# Patient Record
Sex: Female | Born: 1955
Health system: Southern US, Community
[De-identification: ages and names within clinical notes are randomized; demographics above are authoritative.]

## PROBLEM LIST (undated history)

## (undated) DIAGNOSIS — S62109A Fracture of unspecified carpal bone, unspecified wrist, initial encounter for closed fracture: Secondary | ICD-10-CM

## (undated) DIAGNOSIS — S62609A Fracture of unspecified phalanx of unspecified finger, initial encounter for closed fracture: Secondary | ICD-10-CM

## (undated) DIAGNOSIS — R569 Unspecified convulsions: Secondary | ICD-10-CM

## (undated) DIAGNOSIS — B029 Zoster without complications: Secondary | ICD-10-CM

## (undated) DIAGNOSIS — M1990 Unspecified osteoarthritis, unspecified site: Secondary | ICD-10-CM

## (undated) DIAGNOSIS — E039 Hypothyroidism, unspecified: Secondary | ICD-10-CM

## (undated) DIAGNOSIS — E559 Vitamin D deficiency, unspecified: Secondary | ICD-10-CM

## (undated) DIAGNOSIS — E079 Disorder of thyroid, unspecified: Secondary | ICD-10-CM

## (undated) DIAGNOSIS — G40909 Epilepsy, unspecified, not intractable, without status epilepticus: Secondary | ICD-10-CM

## (undated) HISTORY — DX: Fracture of unspecified phalanx of unspecified finger, initial encounter for closed fracture: S62.609A

## (undated) HISTORY — DX: Fracture of unspecified carpal bone, unspecified wrist, initial encounter for closed fracture: S62.109A

## (undated) HISTORY — DX: Epilepsy, unspecified, not intractable, without status epilepticus: G40.909

## (undated) HISTORY — DX: Disorder of thyroid, unspecified: E07.9

## (undated) HISTORY — DX: Vitamin D deficiency, unspecified: E55.9

## (undated) HISTORY — DX: Zoster without complications: B02.9

## (undated) HISTORY — PX: WISDOM TOOTH EXTRACTION: SHX21

---

## 2000-01-12 ENCOUNTER — Encounter: Payer: Self-pay | Admitting: *Deleted

## 2000-01-12 ENCOUNTER — Ambulatory Visit (HOSPITAL_COMMUNITY): Admission: RE | Admit: 2000-01-12 | Discharge: 2000-01-12 | Payer: Self-pay | Admitting: *Deleted

## 2000-02-08 ENCOUNTER — Other Ambulatory Visit: Admission: RE | Admit: 2000-02-08 | Discharge: 2000-02-08 | Payer: Self-pay | Admitting: Obstetrics and Gynecology

## 2001-02-24 ENCOUNTER — Other Ambulatory Visit: Admission: RE | Admit: 2001-02-24 | Discharge: 2001-02-24 | Payer: Self-pay | Admitting: Obstetrics and Gynecology

## 2002-02-26 ENCOUNTER — Other Ambulatory Visit: Admission: RE | Admit: 2002-02-26 | Discharge: 2002-02-26 | Payer: Self-pay | Admitting: Obstetrics and Gynecology

## 2003-05-17 ENCOUNTER — Other Ambulatory Visit: Admission: RE | Admit: 2003-05-17 | Discharge: 2003-05-17 | Payer: Self-pay | Admitting: Obstetrics and Gynecology

## 2005-11-05 ENCOUNTER — Other Ambulatory Visit: Admission: RE | Admit: 2005-11-05 | Discharge: 2005-11-05 | Payer: Self-pay | Admitting: Obstetrics & Gynecology

## 2006-11-04 ENCOUNTER — Other Ambulatory Visit: Admission: RE | Admit: 2006-11-04 | Discharge: 2006-11-04 | Payer: Self-pay | Admitting: Obstetrics and Gynecology

## 2008-01-16 DIAGNOSIS — G40909 Epilepsy, unspecified, not intractable, without status epilepticus: Secondary | ICD-10-CM

## 2008-01-16 HISTORY — PX: KNEE ARTHROSCOPY: SHX127

## 2008-01-16 HISTORY — DX: Epilepsy, unspecified, not intractable, without status epilepticus: G40.909

## 2008-06-15 DIAGNOSIS — E079 Disorder of thyroid, unspecified: Secondary | ICD-10-CM

## 2008-06-15 HISTORY — DX: Disorder of thyroid, unspecified: E07.9

## 2008-07-23 LAB — HM COLONOSCOPY

## 2010-07-26 LAB — HM DEXA SCAN

## 2011-08-02 LAB — HM PAP SMEAR: HM Pap smear: NEGATIVE

## 2012-07-29 ENCOUNTER — Encounter: Payer: Self-pay | Admitting: *Deleted

## 2012-08-04 ENCOUNTER — Ambulatory Visit (INDEPENDENT_AMBULATORY_CARE_PROVIDER_SITE_OTHER): Payer: BC Managed Care – PPO | Admitting: Nurse Practitioner

## 2012-08-04 ENCOUNTER — Encounter: Payer: Self-pay | Admitting: Nurse Practitioner

## 2012-08-04 VITALS — BP 130/72 | HR 78 | Resp 14 | Ht 66.75 in | Wt 248.4 lb

## 2012-08-04 DIAGNOSIS — E559 Vitamin D deficiency, unspecified: Secondary | ICD-10-CM

## 2012-08-04 DIAGNOSIS — Z01419 Encounter for gynecological examination (general) (routine) without abnormal findings: Secondary | ICD-10-CM

## 2012-08-04 DIAGNOSIS — E039 Hypothyroidism, unspecified: Secondary | ICD-10-CM

## 2012-08-04 DIAGNOSIS — Z0000 Encounter for general adult medical examination without abnormal findings: Secondary | ICD-10-CM

## 2012-08-04 LAB — POCT URINALYSIS DIPSTICK
Spec Grav, UA: 1.02
Urobilinogen, UA: NEGATIVE

## 2012-08-04 MED ORDER — VITAMIN D (ERGOCALCIFEROL) 1.25 MG (50000 UNIT) PO CAPS: 50000.0000 [IU] | ORAL_CAPSULE | ORAL | Status: DC

## 2012-08-04 NOTE — Progress Notes (Signed)
57 y.o. [id number] Married Caucasian Fe here for annual exam.  No new health problems.  She has some fatigue during tax season and PCP increased her Synthroid.  She has not gone back to get rechecked and she wants TSH rechecked today.  No LMP recorded. Patient is postmenopausal.          Sexually active: yes  The current method of family planning is vasectomy.    Exercising: no  The patient does not participate in regular exercise at present. Smoker:  no  Health Maintenance: Pap:  08/02/2011 normal with negative HR HPV MMG:  07/26/2010 Colonoscopy:  07/23/2008 scattered diverticula, recheck in 10 years BMD:   07/26/2010  T Score: spine 3.3/ right hip neck -0.7 / total 0.0 TDaP:  PCP maintains tetanus.  Labs: Hgb- 14.0    reports that she has never smoked. She has never used smokeless tobacco. She reports that she drinks about 0.5 ounces of alcohol per week. She reports that she does not use illicit drugs.  Past Medical History  Diagnosis Date  . Thyroid disease     hypothyroidism  . Vitamin D deficiency   . Seizure disorder     Past Surgical History  Procedure Laterality Date  . Vaginal delivery  x3  . Knee arthroscopy Right 2010    Current Outpatient Prescriptions  Medication Sig Dispense Refill  . clobetasol (OLUX) 0.05 % topical foam Apply topically 2 (two) times daily.      Marland Kitchen doxycycline (VIBRA-TABS) 100 MG tablet Take 100 mg by mouth daily.      Marland Kitchen lamoTRIgine (LAMICTAL) 100 MG tablet Take 100 mg by mouth 2 (two) times daily.      Marland Kitchen levothyroxine (SYNTHROID, LEVOTHROID) 50 MCG tablet Take 75 mcg by mouth daily before breakfast.       . meloxicam (MOBIC) 15 MG tablet Take 15 mg by mouth daily.      . metroNIDAZOLE (METROGEL) 0.75 % gel Apply topically 2 (two) times daily.      . Vitamin D, Ergocalciferol, (DRISDOL) 50000 UNITS CAPS Take 50,000 Units by mouth every 7 (seven) days.       No current facility-administered medications for this visit.    Family History  Problem  Relation Age of Onset  . Pulmonary disease Mother   . Cancer Father     prostate cancer  . Hypertension Father   . Renal Disease Father   . Diabetes Paternal Grandfather     ROS:  Pertinent items are noted in HPI.  Otherwise, a comprehensive ROS was negative.  Exam:   BP 130/72  Pulse 78  Resp 14  Ht 5' 6.75" (1.695 m)  Wt 248 lb 6.4 oz (112.674 kg)  BMI 39.22 kg/m2 Height: 5' 6.75" (169.5 cm)  Ht Readings from Last 3 Encounters:  08/04/12 5' 6.75" (1.695 m)    General appearance: alert, cooperative and appears stated age Head: Normocephalic, without obvious abnormality, atraumatic Neck: no adenopathy, supple, symmetrical, trachea midline and thyroid normal to inspection and palpation Lungs: clear to auscultation bilaterally Breasts: normal appearance, no masses or tenderness Heart: regular rate and rhythm Abdomen: soft, non-tender; no masses,  no organomegaly Extremities: extremities normal, atraumatic, no cyanosis or edema Skin: Skin color, texture, turgor normal. No rashes or lesions Lymph nodes: Cervical, supraclavicular, and axillary nodes normal. No abnormal inguinal nodes palpated Neurologic: Grossly normal   Pelvic: External genitalia:  no lesions              Urethra:  normal  appearing urethra with no masses, tenderness or lesions              Bartholin's and Skene's: normal                 Vagina: normal appearing vagina with normal color and discharge, no lesions              Cervix: anteverted              Pap taken: no Bimanual Exam:  Uterus:  normal size, contour, position, consistency, mobility, non-tender              Adnexa: no mass, fullness, tenderness               Rectovaginal: Confirms               Anus:  normal sphincter tone, no lesions  A:  Well Woman with normal exam  Postmenopausal  History of Hypothyroid  History of Vit  D Deficiency  P:   Pap smear as per guidelines   Mammogram due now and patient will make appointment  Since PCP -  Dr. Ricki Miller has changed dose of synthroid - will get labs to him to follow  counseled on breast self exam, adequate intake of calcium and vitamin D,   diet and exercise return annually or prn  An After Visit Summary was printed and given to the patient.

## 2012-08-04 NOTE — Patient Instructions (Signed)

## 2012-08-06 NOTE — Progress Notes (Signed)
Encounter reviewed by Dr. Brook Silva.  

## 2012-08-07 ENCOUNTER — Telehealth: Payer: Self-pay | Admitting: *Deleted

## 2012-08-07 NOTE — Telephone Encounter (Signed)
Pt is aware of all lab results and is agreeable to Vitamin D 50,000 IU 1 po q weekly (pt has Rx left over from last year-2013).

## 2012-08-07 NOTE — Telephone Encounter (Signed)
Message copied by Osie Bond on Thu Aug 07, 2012 10:45 AM ------      Message from: Ria Comment R      Created: Tue Aug 05, 2012  5:41 PM       Let patient know normal labs, follow Vit d per protocol. ------

## 2012-11-20 ENCOUNTER — Other Ambulatory Visit: Payer: Self-pay

## 2013-01-07 ENCOUNTER — Encounter: Payer: Self-pay | Admitting: Nurse Practitioner

## 2013-06-01 ENCOUNTER — Other Ambulatory Visit (HOSPITAL_COMMUNITY): Payer: Self-pay | Admitting: Orthopaedic Surgery

## 2013-06-04 ENCOUNTER — Encounter (HOSPITAL_COMMUNITY): Payer: Self-pay | Admitting: Pharmacy Technician

## 2013-06-09 NOTE — Patient Instructions (Signed)
Audrey Chandler  06/09/2013   Your procedure is scheduled on:  06/19/2013  0945am-1130am  Report to [facility].  Follow the Signs to [place] at       [place]      am  Call this number if you have problems the morning of surgery: [phone]   Remember:   Do not eat food or drink liquids after midnight.   Take these medicines the morning of surgery with A SIP OF WATER:    Do not wear jewelry, make-up or nail polish.  Do not wear lotions, powders, or perfumes.   Do not shave 48 hours prior to surgery.   Do not bring valuables to the hospital.  Contacts, dentures or bridgework may not be worn into surgery.  Leave suitcase in the car. After surgery it may be brought to your room.  For patients admitted to the hospital, checkout time is 11:00 AM the day of  discharge.    [facility] - Preparing for Surgery Before surgery, you can play an important role.  Because skin is not sterile, your skin needs to be as free of germs as possible.  You can reduce the number of germs on your skin by washing with CHG (chlorahexidine gluconate) soap before surgery.  CHG is an antiseptic cleaner which kills germs and bonds with the skin to continue killing germs even after washing. Please DO NOT use if you have an allergy to CHG or antibacterial soaps.  If your skin becomes reddened/irritated stop using the CHG and inform your nurse when you arrive at Short Stay. Do not shave (including legs and underarms) for at least 48 hours prior to the first CHG shower.  You may shave your face/neck. Please follow these instructions carefully:  1.  Shower with CHG Soap the night before surgery and the  morning of Surgery.  2.  If you choose to wash your hair, wash your hair first as usual with your  normal  shampoo.  3.  After you shampoo, rinse your hair and body thoroughly to remove the  shampoo.                           4.  Use CHG as you would any other liquid soap.  You can apply chg directly  to  the skin and wash                       Gently with a scrungie or clean washcloth.  5.  Apply the CHG Soap to your body ONLY FROM THE NECK DOWN.   Do not use on face/ open                           Wound or open sores. Avoid contact with eyes, ears mouth and genitals (private parts).                       Wash face,  Genitals (private parts) with your normal soap.             6.  Wash thoroughly, paying special attention to the area where your surgery  will be performed.  7.  Thoroughly rinse your body with warm water from the neck down.  8.  DO NOT shower/wash with your normal soap after using and rinsing off  the CHG Soap.  9.  Pat yourself dry with a clean towel.            10.  Wear clean pajamas.            11.  Place clean sheets on your bed the night of your first shower and do not  sleep with pets. Day of Surgery : Do not apply any lotions/deodorants the morning of surgery.  Please wear clean clothes to the hospital/surgery center.  FAILURE TO FOLLOW THESE INSTRUCTIONS MAY RESULT IN THE CANCELLATION OF YOUR SURGERY PATIENT SIGNATURE_________________________________  NURSE SIGNATURE__________________________________  ________________________________________________________________________  WHAT IS A BLOOD TRANSFUSION? Blood Transfusion Information  A transfusion is the replacement of blood or some of its parts. Blood is made up of multiple cells which provide different functions.  Red blood cells carry oxygen and are used for blood loss replacement.  White blood cells fight against infection.  Platelets control bleeding.  Plasma helps clot blood.  Other blood products are available for specialized needs, such as hemophilia or other clotting disorders. BEFORE THE TRANSFUSION  Who gives blood for transfusions?   Healthy volunteers who are fully evaluated to make sure their blood is safe. This is blood bank blood. Transfusion therapy is the safest it has ever  been in the practice of medicine. Before blood is taken from a donor, a complete history is taken to make sure that person has no history of diseases nor engages in risky social behavior (examples are intravenous drug use or sexual activity with multiple partners). The donor's travel history is screened to minimize risk of transmitting infections, such as malaria. The donated blood is tested for signs of infectious diseases, such as HIV and hepatitis. The blood is then tested to be sure it is compatible with you in order to minimize the chance of a transfusion reaction. If you or a relative donates blood, this is often done in anticipation of surgery and is not appropriate for emergency situations. It takes many days to process the donated blood. RISKS AND COMPLICATIONS Although transfusion therapy is very safe and saves many lives, the main dangers of transfusion include:   Getting an infectious disease.  Developing a transfusion reaction. This is an allergic reaction to something in the blood you were given. Every precaution is taken to prevent this. The decision to have a blood transfusion has been considered carefully by your caregiver before blood is given. Blood is not given unless the benefits outweigh the risks. AFTER THE TRANSFUSION  Right after receiving a blood transfusion, you will usually feel much better and more energetic. This is especially true if your red blood cells have gotten low (anemic). The transfusion raises the level of the red blood cells which carry oxygen, and this usually causes an energy increase.  The nurse administering the transfusion will monitor you carefully for complications. HOME CARE INSTRUCTIONS  No special instructions are needed after a transfusion. You may find your energy is better. Speak with your caregiver about any limitations on activity for underlying diseases you may have. SEEK MEDICAL CARE IF:   Your condition is not improving after your  transfusion.  You develop redness or irritation at the intravenous (IV) site. SEEK IMMEDIATE MEDICAL CARE IF:  Any of the following symptoms occur over the next 12 hours:  Shaking chills.  You have a temperature by mouth above 102 F (38.9 C), not controlled by medicine.  Chest, back, or muscle pain.  People around you feel you are not acting correctly or  are confused.  Shortness of breath or difficulty breathing.  Dizziness and fainting.  You get a rash or develop hives.  You have a decrease in urine output.  Your urine turns a dark color or changes to pink, red, or brown. Any of the following symptoms occur over the next 10 days:  You have a temperature by mouth above 102 F (38.9 C), not controlled by medicine.  Shortness of breath.  Weakness after normal activity.  The white part of the eye turns yellow (jaundice).  You have a decrease in the amount of urine or are urinating less often.  Your urine turns a dark color or changes to pink, red, or brown. Document Released: 12/30/1999 Document Revised: 03/26/2011 Document Reviewed: 08/18/2007 ExitCare Patient Information 2014 ExitCare, [place].  _______________________________________________________________________   Please read over the following fact sheets that you were given: MRSA Information, coughing and deep breathing exercises, leg exercises

## 2013-06-11 ENCOUNTER — Encounter (HOSPITAL_COMMUNITY): Payer: Self-pay

## 2013-06-11 ENCOUNTER — Encounter (HOSPITAL_COMMUNITY)
Admission: RE | Admit: 2013-06-11 | Discharge: 2013-06-11 | Disposition: A | Discharge status: Home / Self Care | Payer: BC Managed Care – PPO | Source: Ambulatory Visit | Attending: Orthopaedic Surgery | Admitting: Orthopaedic Surgery

## 2013-06-11 DIAGNOSIS — Z01812 Encounter for preprocedural laboratory examination: Secondary | ICD-10-CM | POA: Insufficient documentation

## 2013-06-11 HISTORY — DX: Unspecified convulsions: R56.9

## 2013-06-11 HISTORY — DX: Hypothyroidism, unspecified: E03.9

## 2013-06-11 HISTORY — DX: Unspecified osteoarthritis, unspecified site: M19.90

## 2013-06-11 LAB — CBC
HCT: 41.4 % (ref 36.0–46.0)
HEMOGLOBIN: 14.1 g/dL (ref 12.0–15.0)
MCH: 29.8 pg (ref 26.0–34.0)
MCHC: 34.1 g/dL (ref 30.0–36.0)
MCV: 87.5 fL (ref 78.0–100.0)
Platelets: 183 10*3/uL (ref 150–400)
RBC: 4.73 MIL/uL (ref 3.87–5.11)
RDW: 12.7 % (ref 11.5–15.5)
WBC: 4.7 10*3/uL (ref 4.0–10.5)

## 2013-06-11 LAB — BASIC METABOLIC PANEL
BUN: 18 mg/dL (ref 6–23)
CHLORIDE: 102 meq/L (ref 96–112)
CO2: 29 mEq/L (ref 19–32)
Calcium: 9.3 mg/dL (ref 8.4–10.5)
Creatinine, Ser: 0.91 mg/dL (ref 0.50–1.10)
GFR calc non Af Amer: 68 mL/min — ABNORMAL LOW (ref >90)
GFR, EST AFRICAN AMERICAN: 78 mL/min — AB (ref >90)
Glucose, Bld: 107 mg/dL — ABNORMAL HIGH (ref 70–99)
POTASSIUM: 4.5 meq/L (ref 3.7–5.3)
SODIUM: 143 meq/L (ref 137–147)

## 2013-06-11 LAB — URINALYSIS, ROUTINE W REFLEX MICROSCOPIC
Bilirubin Urine: NEGATIVE
Glucose, UA: NEGATIVE mg/dL
Hgb urine dipstick: NEGATIVE
Ketones, ur: NEGATIVE mg/dL
Leukocytes, UA: NEGATIVE
Nitrite: NEGATIVE
PROTEIN: NEGATIVE mg/dL
Specific Gravity, Urine: 1.014 (ref 1.005–1.030)
Urobilinogen, UA: 0.2 mg/dL (ref 0.0–1.0)
pH: 6 (ref 5.0–8.0)

## 2013-06-11 LAB — PROTIME-INR
INR: 1 (ref 0.00–1.49)
PROTHROMBIN TIME: 13 s (ref 11.6–15.2)

## 2013-06-11 LAB — SURGICAL PCR SCREEN
MRSA, PCR: NEGATIVE
STAPHYLOCOCCUS AUREUS: NEGATIVE

## 2013-06-11 LAB — APTT: aPTT: 34 seconds (ref 24–37)

## 2013-06-19 ENCOUNTER — Encounter (HOSPITAL_COMMUNITY): Payer: BC Managed Care – PPO | Admitting: Anesthesiology

## 2013-06-19 ENCOUNTER — Encounter (HOSPITAL_COMMUNITY): Payer: Self-pay | Admitting: *Deleted

## 2013-06-19 ENCOUNTER — Encounter (HOSPITAL_COMMUNITY)
Admission: RE | Disposition: A | Discharge status: Home Health | Payer: Self-pay | Source: Ambulatory Visit | Attending: Orthopaedic Surgery

## 2013-06-19 ENCOUNTER — Inpatient Hospital Stay (HOSPITAL_COMMUNITY): Payer: BC Managed Care – PPO

## 2013-06-19 ENCOUNTER — Inpatient Hospital Stay (HOSPITAL_COMMUNITY): Payer: BC Managed Care – PPO | Admitting: Anesthesiology

## 2013-06-19 ENCOUNTER — Inpatient Hospital Stay (HOSPITAL_COMMUNITY)
Admission: RE | Admit: 2013-06-19 | Discharge: 2013-06-22 | DRG: 470 | Disposition: A | Discharge status: Home Health | Payer: BC Managed Care – PPO | Source: Ambulatory Visit | Attending: Orthopaedic Surgery | Admitting: Orthopaedic Surgery

## 2013-06-19 DIAGNOSIS — Z6839 Body mass index (BMI) 39.0-39.9, adult: Secondary | ICD-10-CM

## 2013-06-19 DIAGNOSIS — E039 Hypothyroidism, unspecified: Secondary | ICD-10-CM | POA: Diagnosis present

## 2013-06-19 DIAGNOSIS — M1612 Unilateral primary osteoarthritis, left hip: Secondary | ICD-10-CM

## 2013-06-19 DIAGNOSIS — Z96649 Presence of unspecified artificial hip joint: Secondary | ICD-10-CM

## 2013-06-19 DIAGNOSIS — M169 Osteoarthritis of hip, unspecified: Principal | ICD-10-CM | POA: Diagnosis present

## 2013-06-19 DIAGNOSIS — Z8249 Family history of ischemic heart disease and other diseases of the circulatory system: Secondary | ICD-10-CM

## 2013-06-19 DIAGNOSIS — Z833 Family history of diabetes mellitus: Secondary | ICD-10-CM

## 2013-06-19 DIAGNOSIS — Z87891 Personal history of nicotine dependence: Secondary | ICD-10-CM

## 2013-06-19 DIAGNOSIS — M1610 Unilateral primary osteoarthritis, unspecified hip: Principal | ICD-10-CM | POA: Diagnosis present

## 2013-06-19 DIAGNOSIS — Z01812 Encounter for preprocedural laboratory examination: Secondary | ICD-10-CM

## 2013-06-19 HISTORY — PX: TOTAL HIP ARTHROPLASTY: SHX124

## 2013-06-19 LAB — TYPE AND SCREEN
ABO/RH(D): O POS
Antibody Screen: NEGATIVE

## 2013-06-19 LAB — ABO/RH: ABO/RH(D): O POS

## 2013-06-19 SURGERY — ARTHROPLASTY, HIP, TOTAL, ANTERIOR APPROACH
Anesthesia: Spinal | Site: Hip | Laterality: Left

## 2013-06-19 MED ORDER — METHOCARBAMOL 1000 MG/10ML IJ SOLN
500.0000 mg | Freq: Four times a day (QID) | INTRAVENOUS | Status: DC | PRN
  Administered 2013-06-19: 500 mg via INTRAVENOUS
  Filled 2013-06-19: qty 5

## 2013-06-19 MED ORDER — MIDAZOLAM HCL 2 MG/2ML IJ SOLN
INTRAMUSCULAR | Status: AC
  Filled 2013-06-19: qty 2

## 2013-06-19 MED ORDER — PROPOFOL 10 MG/ML IV BOLUS
INTRAVENOUS | Status: AC
  Filled 2013-06-19: qty 20

## 2013-06-19 MED ORDER — PROPOFOL INFUSION 10 MG/ML OPTIME
INTRAVENOUS | Status: DC | PRN
  Administered 2013-06-19: 100 ug/kg/min via INTRAVENOUS

## 2013-06-19 MED ORDER — ONDANSETRON HCL 4 MG/2ML IJ SOLN
4.0000 mg | Freq: Four times a day (QID) | INTRAMUSCULAR | Status: DC | PRN
  Administered 2013-06-19: 4 mg via INTRAVENOUS
  Filled 2013-06-19: qty 2

## 2013-06-19 MED ORDER — DOCUSATE SODIUM 100 MG PO CAPS
100.0000 mg | ORAL_CAPSULE | Freq: Two times a day (BID) | ORAL | Status: DC
  Administered 2013-06-19 – 2013-06-22 (×6): 100 mg via ORAL

## 2013-06-19 MED ORDER — LACTATED RINGERS IV SOLN
INTRAVENOUS | Status: DC | PRN
  Administered 2013-06-19 (×3): via INTRAVENOUS

## 2013-06-19 MED ORDER — 0.9 % SODIUM CHLORIDE (POUR BTL) OPTIME
TOPICAL | Status: DC | PRN
  Administered 2013-06-19: 1000 mL

## 2013-06-19 MED ORDER — HYDROMORPHONE HCL PF 1 MG/ML IJ SOLN
1.0000 mg | INTRAMUSCULAR | Status: DC | PRN
  Administered 2013-06-19 – 2013-06-20 (×3): 1 mg via INTRAVENOUS
  Filled 2013-06-19 (×3): qty 1

## 2013-06-19 MED ORDER — CEFAZOLIN SODIUM-DEXTROSE 2-3 GM-% IV SOLR
2.0000 g | Freq: Four times a day (QID) | INTRAVENOUS | Status: AC
  Administered 2013-06-19 (×2): 2 g via INTRAVENOUS
  Filled 2013-06-19 (×2): qty 50

## 2013-06-19 MED ORDER — PROMETHAZINE HCL 25 MG/ML IJ SOLN: 6.2500 mg | INTRAMUSCULAR | Status: DC | PRN

## 2013-06-19 MED ORDER — ALUM & MAG HYDROXIDE-SIMETH 200-200-20 MG/5ML PO SUSP: 30.0000 mL | ORAL | Status: DC | PRN

## 2013-06-19 MED ORDER — ASPIRIN EC 325 MG PO TBEC
325.0000 mg | DELAYED_RELEASE_TABLET | Freq: Two times a day (BID) | ORAL | Status: DC
  Administered 2013-06-20 – 2013-06-22 (×5): 325 mg via ORAL
  Filled 2013-06-19 (×8): qty 1

## 2013-06-19 MED ORDER — SODIUM CHLORIDE 0.9 % IR SOLN
Status: DC | PRN
  Administered 2013-06-19: 1000 mL

## 2013-06-19 MED ORDER — BUPIVACAINE HCL (PF) 0.5 % IJ SOLN
INTRAMUSCULAR | Status: AC
  Filled 2013-06-19: qty 30

## 2013-06-19 MED ORDER — SODIUM CHLORIDE 0.9 % IV SOLN
INTRAVENOUS | Status: DC
  Administered 2013-06-19 – 2013-06-20 (×2): via INTRAVENOUS

## 2013-06-19 MED ORDER — BUPIVACAINE HCL (PF) 0.5 % IJ SOLN
INTRAMUSCULAR | Status: DC | PRN
  Administered 2013-06-19: 3 mL

## 2013-06-19 MED ORDER — METOCLOPRAMIDE HCL 5 MG/ML IJ SOLN: 5.0000 mg | Freq: Three times a day (TID) | INTRAMUSCULAR | Status: DC | PRN

## 2013-06-19 MED ORDER — POLYETHYLENE GLYCOL 3350 17 G PO PACK
17.0000 g | PACK | Freq: Every day | ORAL | Status: DC | PRN
  Administered 2013-06-20 – 2013-06-21 (×2): 17 g via ORAL

## 2013-06-19 MED ORDER — ONDANSETRON HCL 4 MG/2ML IJ SOLN
INTRAMUSCULAR | Status: AC
  Filled 2013-06-19: qty 2

## 2013-06-19 MED ORDER — KETAMINE HCL 10 MG/ML IJ SOLN
INTRAMUSCULAR | Status: DC | PRN
  Administered 2013-06-19 (×5): 5 mg via INTRAVENOUS

## 2013-06-19 MED ORDER — MEPERIDINE HCL 50 MG/ML IJ SOLN: 6.2500 mg | INTRAMUSCULAR | Status: DC | PRN

## 2013-06-19 MED ORDER — DIPHENHYDRAMINE HCL 12.5 MG/5ML PO ELIX: 12.5000 mg | ORAL_SOLUTION | ORAL | Status: DC | PRN

## 2013-06-19 MED ORDER — OXYCODONE HCL 5 MG/5ML PO SOLN
5.0000 mg | Freq: Once | ORAL | Status: DC | PRN
  Filled 2013-06-19: qty 5

## 2013-06-19 MED ORDER — METOCLOPRAMIDE HCL 10 MG PO TABS: 5.0000 mg | ORAL_TABLET | Freq: Three times a day (TID) | ORAL | Status: DC | PRN

## 2013-06-19 MED ORDER — OXYCODONE HCL 5 MG PO TABS: 5.0000 mg | ORAL_TABLET | Freq: Once | ORAL | Status: DC | PRN

## 2013-06-19 MED ORDER — MENTHOL 3 MG MT LOZG
1.0000 | LOZENGE | OROMUCOSAL | Status: DC | PRN
  Filled 2013-06-19: qty 9

## 2013-06-19 MED ORDER — LIDOCAINE HCL (CARDIAC) 20 MG/ML IV SOLN
INTRAVENOUS | Status: AC
  Filled 2013-06-19: qty 5

## 2013-06-19 MED ORDER — METHOCARBAMOL 500 MG PO TABS
500.0000 mg | ORAL_TABLET | Freq: Four times a day (QID) | ORAL | Status: DC | PRN
  Administered 2013-06-19 – 2013-06-20 (×2): 500 mg via ORAL
  Filled 2013-06-19 (×2): qty 1

## 2013-06-19 MED ORDER — OXYCODONE HCL 5 MG PO TABS
5.0000 mg | ORAL_TABLET | ORAL | Status: DC | PRN
  Administered 2013-06-19: 5 mg via ORAL
  Administered 2013-06-20 (×5): 10 mg via ORAL
  Filled 2013-06-19: qty 2
  Filled 2013-06-19: qty 1
  Filled 2013-06-19 (×4): qty 2

## 2013-06-19 MED ORDER — HYDROMORPHONE HCL PF 1 MG/ML IJ SOLN: 0.2500 mg | INTRAMUSCULAR | Status: DC | PRN

## 2013-06-19 MED ORDER — CEFAZOLIN SODIUM-DEXTROSE 2-3 GM-% IV SOLR
INTRAVENOUS | Status: AC
  Filled 2013-06-19: qty 50

## 2013-06-19 MED ORDER — ONDANSETRON HCL 4 MG PO TABS
4.0000 mg | ORAL_TABLET | Freq: Four times a day (QID) | ORAL | Status: DC | PRN
  Administered 2013-06-21: 4 mg via ORAL

## 2013-06-19 MED ORDER — ONDANSETRON HCL 4 MG/2ML IJ SOLN
INTRAMUSCULAR | Status: DC | PRN
  Administered 2013-06-19: 4 mg via INTRAVENOUS

## 2013-06-19 MED ORDER — PHENOL 1.4 % MT LIQD
1.0000 | OROMUCOSAL | Status: DC | PRN
  Filled 2013-06-19: qty 177

## 2013-06-19 MED ORDER — TRANEXAMIC ACID 100 MG/ML IV SOLN
1000.0000 mg | INTRAVENOUS | Status: AC
  Administered 2013-06-19: 1000 mg via INTRAVENOUS
  Filled 2013-06-19: qty 10

## 2013-06-19 MED ORDER — ACETAMINOPHEN 10 MG/ML IV SOLN
1000.0000 mg | Freq: Once | INTRAVENOUS | Status: AC
Start: 2013-06-19 — End: 2013-06-19
  Administered 2013-06-19: 1000 mg via INTRAVENOUS
  Filled 2013-06-19: qty 100

## 2013-06-19 MED ORDER — ACETAMINOPHEN 650 MG RE SUPP: 650.0000 mg | Freq: Four times a day (QID) | RECTAL | Status: DC | PRN

## 2013-06-19 MED ORDER — PROPOFOL 10 MG/ML IV EMUL
INTRAVENOUS | Status: DC | PRN
  Administered 2013-06-19: 30 mg via INTRAVENOUS

## 2013-06-19 MED ORDER — CLOBETASOL PROPIONATE 0.05 % EX FOAM: 1.0000 "application " | Freq: Two times a day (BID) | CUTANEOUS | Status: DC | PRN

## 2013-06-19 MED ORDER — LAMOTRIGINE 100 MG PO TABS
100.0000 mg | ORAL_TABLET | Freq: Two times a day (BID) | ORAL | Status: DC
  Administered 2013-06-19 – 2013-06-22 (×6): 100 mg via ORAL
  Filled 2013-06-19 (×7): qty 1

## 2013-06-19 MED ORDER — MIDAZOLAM HCL 5 MG/5ML IJ SOLN
INTRAMUSCULAR | Status: DC | PRN
  Administered 2013-06-19: 2 mg via INTRAVENOUS

## 2013-06-19 MED ORDER — DOXYCYCLINE HYCLATE 100 MG PO TABS
100.0000 mg | ORAL_TABLET | Freq: Every day | ORAL | Status: DC
  Administered 2013-06-19: 100 mg via ORAL
  Filled 2013-06-19 (×2): qty 1

## 2013-06-19 MED ORDER — LEVOTHYROXINE SODIUM 75 MCG PO TABS
75.0000 ug | ORAL_TABLET | Freq: Every day | ORAL | Status: DC
  Administered 2013-06-20 – 2013-06-22 (×3): 75 ug via ORAL
  Filled 2013-06-19 (×4): qty 1

## 2013-06-19 MED ORDER — ACETAMINOPHEN 325 MG PO TABS
650.0000 mg | ORAL_TABLET | Freq: Four times a day (QID) | ORAL | Status: DC | PRN
  Administered 2013-06-20 – 2013-06-22 (×4): 650 mg via ORAL
  Filled 2013-06-19 (×5): qty 2

## 2013-06-19 MED ORDER — CEFAZOLIN SODIUM-DEXTROSE 2-3 GM-% IV SOLR
2.0000 g | INTRAVENOUS | Status: AC
  Administered 2013-06-19: 2 g via INTRAVENOUS

## 2013-06-19 SURGICAL SUPPLY — 45 items
APL SKNCLS STERI-STRIP NONHPOA (GAUZE/BANDAGES/DRESSINGS)
BAG SPEC THK2 15X12 ZIP CLS (MISCELLANEOUS)
BAG ZIPLOCK 12X15 (MISCELLANEOUS) IMPLANT
BENZOIN TINCTURE PRP APPL 2/3 (GAUZE/BANDAGES/DRESSINGS) IMPLANT
BLADE SAW SGTL 18X1.27X75 (BLADE) ×2 IMPLANT
BLADE SAW SGTL 18X1.27X75MM (BLADE) ×1
CAPT HIP PF COP ×2 IMPLANT
CELLS DAT CNTRL 66122 CELL SVR (MISCELLANEOUS) ×1 IMPLANT
CLOSURE WOUND 1/2 X4 (GAUZE/BANDAGES/DRESSINGS)
COVER PERINEAL POST (MISCELLANEOUS) ×3 IMPLANT
DRAPE C-ARM 42X120 X-RAY (DRAPES) ×3 IMPLANT
DRAPE STERI IOBAN 125X83 (DRAPES) ×3 IMPLANT
DRAPE U-SHAPE 47X51 STRL (DRAPES) ×9 IMPLANT
DRSG AQUACEL AG ADV 3.5X10 (GAUZE/BANDAGES/DRESSINGS) ×3 IMPLANT
DRSG XEROFORM 1X8 (GAUZE/BANDAGES/DRESSINGS) ×2 IMPLANT
DURAPREP 26ML APPLICATOR (WOUND CARE) ×3 IMPLANT
ELECT BLADE TIP CTD 4 INCH (ELECTRODE) ×3 IMPLANT
ELECT REM PT RETURN 9FT ADLT (ELECTROSURGICAL) ×3
ELECTRODE REM PT RTRN 9FT ADLT (ELECTROSURGICAL) ×1 IMPLANT
FACESHIELD WRAPAROUND (MASK) ×12 IMPLANT
FACESHIELD WRAPAROUND OR TEAM (MASK) ×4 IMPLANT
GAUZE XEROFORM 1X8 LF (GAUZE/BANDAGES/DRESSINGS) ×2 IMPLANT
GLOVE BIO SURGEON STRL SZ7.5 (GLOVE) ×3 IMPLANT
GLOVE BIOGEL PI IND STRL 8 (GLOVE) ×2 IMPLANT
GLOVE BIOGEL PI INDICATOR 8 (GLOVE) ×4
GLOVE ECLIPSE 8.0 STRL XLNG CF (GLOVE) ×3 IMPLANT
GOWN STRL REUS W/TWL XL LVL3 (GOWN DISPOSABLE) ×6 IMPLANT
HANDPIECE INTERPULSE COAX TIP (DISPOSABLE) ×3
KIT BASIN OR (CUSTOM PROCEDURE TRAY) ×3 IMPLANT
PACK TOTAL JOINT (CUSTOM PROCEDURE TRAY) ×3 IMPLANT
RETRACTOR WND ALEXIS 18 MED (MISCELLANEOUS) ×1 IMPLANT
RTRCTR WOUND ALEXIS 18CM MED (MISCELLANEOUS) ×3
SET HNDPC FAN SPRY TIP SCT (DISPOSABLE) ×1 IMPLANT
STAPLER VISISTAT 35W (STAPLE) ×2 IMPLANT
STRIP CLOSURE SKIN 1/2X4 (GAUZE/BANDAGES/DRESSINGS) IMPLANT
SUT ETHIBOND NAB CT1 #1 30IN (SUTURE) ×3 IMPLANT
SUT ETHILON 3 0 PS 1 (SUTURE) IMPLANT
SUT MNCRL AB 4-0 PS2 18 (SUTURE) ×1 IMPLANT
SUT VIC AB 0 CT1 36 (SUTURE) ×5 IMPLANT
SUT VIC AB 1 CT1 36 (SUTURE) ×3 IMPLANT
SUT VIC AB 2-0 CT1 27 (SUTURE) ×12
SUT VIC AB 2-0 CT1 TAPERPNT 27 (SUTURE) ×2 IMPLANT
TOWEL OR 17X26 10 PK STRL BLUE (TOWEL DISPOSABLE) ×3 IMPLANT
TOWEL OR NON WOVEN STRL DISP B (DISPOSABLE) ×3 IMPLANT
TRAY FOLEY CATH 14FRSI W/METER (CATHETERS) ×2 IMPLANT

## 2013-06-19 NOTE — Anesthesia Procedure Notes (Signed)
Spinal  Patient location during procedure: OR Start time: 06/19/2013 9:42 AM End time: 06/19/2013 9:44 AM Staffing Anesthesiologist: Nolon Nations R Performed by: anesthesiologist  Preanesthetic Checklist Completed: patient identified, site marked, surgical consent, pre-op evaluation, timeout performed, IV checked, risks and benefits discussed and monitors and equipment checked Spinal Block Patient position: sitting Prep: Betadine Patient monitoring: heart rate, continuous pulse ox and blood pressure Location: L3-4 Injection technique: single-shot Needle Needle type: Sprotte  Needle gauge: 24 G Needle length: 9 cm Assessment Sensory level: T8 Additional Notes Expiration date of kit checked and confirmed. Patient tolerated procedure well, without complications.

## 2013-06-19 NOTE — Addendum Note (Signed)
Addendum created 06/19/13 1250 by Glory Buff, CRNA   Modules edited: Anesthesia Flowsheet

## 2013-06-19 NOTE — Progress Notes (Signed)
Portable AP Pelvis and Lateral Left Hip X-rays done. 

## 2013-06-19 NOTE — Anesthesia Postprocedure Evaluation (Signed)
Anesthesia Post Note  Patient: LEEANNA SLABY  Procedure(s) Performed: Procedure(s) (LRB): LEFT TOTAL HIP ARTHROPLASTY ANTERIOR APPROACH (Left)  Anesthesia type: Spinal  Patient location: PACU  Post pain: Pain level controlled  Post assessment: Post-op Vital signs reviewed  Last Vitals: BP 137/70  Pulse 62  Temp(Src) 36.4 C (Oral)  Resp 16  SpO2 100%  Post vital signs: Reviewed  Level of consciousness: sedated  Complications: No apparent anesthesia complications

## 2013-06-19 NOTE — Anesthesia Preprocedure Evaluation (Addendum)
Anesthesia Evaluation  Patient identified by MRN, date of birth, ID band Patient awake    Reviewed: Allergy & Precautions, H&P , NPO status , Patient's Chart, lab work & pertinent test results  Airway Mallampati: II TM Distance: >3 FB Neck ROM: Full    Dental  (+) Dental Advisory Given   Pulmonary former smoker,  breath sounds clear to auscultation        Cardiovascular negative cardio ROS  Rhythm:Regular Rate:Normal     Neuro/Psych Seizures -,  negative psych ROS   GI/Hepatic negative GI ROS, Neg liver ROS,   Endo/Other  Hypothyroidism Morbid obesity  Renal/GU negative Renal ROS     Musculoskeletal negative musculoskeletal ROS (+)   Abdominal   Peds  Hematology negative hematology ROS (+)   Anesthesia Other Findings   Reproductive/Obstetrics negative OB ROS                          Anesthesia Physical Anesthesia Plan  ASA: II  Anesthesia Plan: Spinal   Post-op Pain Management:    Induction:   Airway Management Planned:   Additional Equipment:   Intra-op Plan:   Post-operative Plan:   Informed Consent: I have reviewed the patients History and Physical, chart, labs and discussed the procedure including the risks, benefits and alternatives for the proposed anesthesia with the patient or authorized representative who has indicated his/her understanding and acceptance.   Dental advisory given  Plan Discussed with: CRNA  Anesthesia Plan Comments:         Anesthesia Quick Evaluation

## 2013-06-19 NOTE — Progress Notes (Signed)
X-ray results noted 

## 2013-06-19 NOTE — Transfer of Care (Signed)
Immediate Anesthesia Transfer of Care Note  Patient: Audrey Chandler  Procedure(s) Performed: Procedure(s): LEFT TOTAL HIP ARTHROPLASTY ANTERIOR APPROACH (Left)  Patient Location: PACU  Anesthesia Type:Spinal  Level of Consciousness: awake, alert  and oriented  Airway & Oxygen Therapy: Patient Spontanous Breathing and Patient connected to face mask oxygen  Post-op Assessment: Report given to PACU RN and Post -op Vital signs reviewed and stable  Post vital signs: Reviewed and stable  Complications: No apparent anesthesia complications

## 2013-06-19 NOTE — H&P (Deleted)
TOTAL HIP ADMISSION H&P  Patient is admitted for left total hip arthroplasty.  Subjective:  Chief Complaint: left hip pain  HPI: Audrey Chandler, 58 y.o. female, has a history of pain and functional disability in the left hip(s) due to avascular necrosis and patient has failed non-surgical conservative treatments for greater than 12 weeks to include NSAID's and/or analgesics, use of assistive devices, weight reduction as appropriate and activity modification.  Onset of symptoms was gradual starting 3 years ago with rapidlly worsening course since that time.The patient noted no past surgery on the left hip(s).  Patient currently rates pain in the left hip at 10 out of 10 with activity. Patient has night pain, worsening of pain with activity and weight bearing, trendelenberg gait, pain that interfers with activities of daily living and pain with passive range of motion. Patient has evidence of subchondral cysts, periarticular osteophytes and joint space narrowing by imaging studies. This condition presents safety issues increasing the risk of falls.  There is no current active infection.  Patient Active Problem List   Diagnosis Date Noted  . Arthritis of left hip 06/19/2013  . Avascular necrosis of bone of left hip 06/19/2013   Past Medical History  Diagnosis Date  . Vitamin D deficiency   . Seizure disorder 2010  . Thyroid disease 06/2008    hypothyroidism  . Closed fracture dislocation of multiple fingers 7th grade    left hand 4th & 5th fingers  . Fracture of wrist, closed 6th grade    right  . Hypothyroidism   . Seizures   . Arthritis     Past Surgical History  Procedure Laterality Date  . Vaginal delivery  x3  . Knee arthroscopy Right 2010  . Wisdom tooth extraction  age 66    No prescriptions prior to admission   No Known Allergies  History  Substance Use Topics  . Smoking status: Former Smoker -- 0.50 packs/day for 2 years  . Smokeless tobacco: Never Used  . Alcohol  Use: 0.5 oz/week    1 drink(s) per week     Comment: occasional glass of wine     Family History  Problem Relation Age of Onset  . Pulmonary disease Mother   . Heart failure Mother   . Cancer Father     prostate cancer  . Hypertension Father   . Renal Disease Father   . Diabetes Paternal Grandfather   . Bipolar disorder Sister   . Renal Disease Maternal Aunt      Review of Systems  Musculoskeletal: Positive for joint pain.  All other systems reviewed and are negative.   Objective:  Physical Exam  Constitutional: She is oriented to person, place, and time. She appears well-developed and well-nourished.  HENT:  Head: Normocephalic and atraumatic.  Eyes: EOM are normal. Pupils are equal, round, and reactive to light.  Neck: Normal range of motion. Neck supple.  Cardiovascular: Normal rate and regular rhythm.   Respiratory: Effort normal and breath sounds normal.  GI: Soft. Bowel sounds are normal.  Musculoskeletal:       Left hip: She exhibits decreased range of motion, bony tenderness and crepitus.  Neurological: She is alert and oriented to person, place, and time.  Skin: Skin is warm and dry.  Psychiatric: She has a normal mood and affect.    Vital signs in last 24 hours:    Labs:   Estimated body mass index is 39.22 kg/(m^2) as calculated from the following:   Height as  of 08/04/12: 5' 6.75" (1.695 m).   Weight as of 08/04/12: 112.674 kg (248 lb 6.4 oz).   Imaging Review Plain radiographs demonstrate severe avascular necrosisi of the left hip(s). The bone quality appears to be good for age and reported activity level.  Assessment/Plan:  End stage arthritis, left hip(s)  The patient history, physical examination, clinical judgement of the provider and imaging studies are consistent with end stage degenerative joint disease of the left hip(s) and total hip arthroplasty is deemed medically necessary. The treatment options including medical management, injection  therapy, arthroscopy and arthroplasty were discussed at length. The risks and benefits of total hip arthroplasty were presented and reviewed. The risks due to aseptic loosening, infection, stiffness, dislocation/subluxation,  thromboembolic complications and other imponderables were discussed.  The patient acknowledged the explanation, agreed to proceed with the plan and consent was signed. Patient is being admitted for inpatient treatment for surgery, pain control, PT, OT, prophylactic antibiotics, VTE prophylaxis, progressive ambulation and ADL's and discharge planning.The patient is planning to be discharged home with home health services

## 2013-06-19 NOTE — H&P (Signed)
TOTAL HIP ADMISSION H&P  Patient is admitted for left total hip arthroplasty.  Subjective:  Chief Complaint: left hip pain  HPI: Audrey Chandler, 58 y.o. female, has a history of pain and functional disability in the left hip(s) due to arthritis and patient has failed non-surgical conservative treatments for greater than 12 weeks to include NSAID's and/or analgesics, use of assistive devices, weight reduction as appropriate and activity modification.  Onset of symptoms was gradual starting 2 years ago with gradually worsening course since that time.The patient noted no past surgery on the left hip(s).  Patient currently rates pain in the left hip at 10 out of 10 with activity. Patient has night pain, worsening of pain with activity and weight bearing, trendelenberg gait, pain that interfers with activities of daily living and pain with passive range of motion. Patient has evidence of subchondral sclerosis, periarticular osteophytes and joint space narrowing by imaging studies. This condition presents safety issues increasing the risk of falls.  There is no current active infection.  Patient Active Problem List   Diagnosis Date Noted  . Arthritis of left hip 06/19/2013   Past Medical History  Diagnosis Date  . Vitamin D deficiency   . Seizure disorder 2010  . Thyroid disease 06/2008    hypothyroidism  . Closed fracture dislocation of multiple fingers 7th grade    left hand 4th & 5th fingers  . Fracture of wrist, closed 6th grade    right  . Hypothyroidism   . Seizures   . Arthritis     Past Surgical History  Procedure Laterality Date  . Vaginal delivery  x3  . Knee arthroscopy Right 2010  . Wisdom tooth extraction  age 69    No prescriptions prior to admission   No Known Allergies  History  Substance Use Topics  . Smoking status: Former Smoker -- 0.50 packs/day for 2 years  . Smokeless tobacco: Never Used  . Alcohol Use: 0.5 oz/week    1 drink(s) per week     Comment:  occasional glass of wine     Family History  Problem Relation Age of Onset  . Pulmonary disease Mother   . Heart failure Mother   . Cancer Father     prostate cancer  . Hypertension Father   . Renal Disease Father   . Diabetes Paternal Grandfather   . Bipolar disorder Sister   . Renal Disease Maternal Aunt      Review of Systems  Musculoskeletal: Positive for joint pain.  All other systems reviewed and are negative.   Objective:  Physical Exam  Constitutional: She is oriented to person, place, and time. She appears well-developed and well-nourished.  HENT:  Head: Normocephalic and atraumatic.  Eyes: EOM are normal. Pupils are equal, round, and reactive to light.  Neck: Normal range of motion. Neck supple.  Cardiovascular: Normal rate and regular rhythm.   Respiratory: Effort normal and breath sounds normal.  GI: Soft. Bowel sounds are normal.  Musculoskeletal:       Left hip: She exhibits decreased range of motion, decreased strength and bony tenderness.  Neurological: She is alert and oriented to person, place, and time.  Skin: Skin is warm and dry.  Psychiatric: She has a normal mood and affect.    Vital signs in last 24 hours:    Labs:   Estimated body mass index is 39.22 kg/(m^2) as calculated from the following:   Height as of 08/04/12: 5' 6.75" (1.695 m).   Weight as  of 08/04/12: 112.674 kg (248 lb 6.4 oz).   Imaging Review Plain radiographs demonstrate severe degenerative joint disease of the left hip(s). The bone quality appears to be good for age and reported activity level.  Assessment/Plan:  End stage arthritis, left hip(s)  The patient history, physical examination, clinical judgement of the provider and imaging studies are consistent with end stage degenerative joint disease of the left hip(s) and total hip arthroplasty is deemed medically necessary. The treatment options including medical management, injection therapy, arthroscopy and arthroplasty  were discussed at length. The risks and benefits of total hip arthroplasty were presented and reviewed. The risks due to aseptic loosening, infection, stiffness, dislocation/subluxation,  thromboembolic complications and other imponderables were discussed.  The patient acknowledged the explanation, agreed to proceed with the plan and consent was signed. Patient is being admitted for inpatient treatment for surgery, pain control, PT, OT, prophylactic antibiotics, VTE prophylaxis, progressive ambulation and ADL's and discharge planning.The patient is planning to be discharged home with home health services

## 2013-06-19 NOTE — Brief Op Note (Signed)
06/19/2013  11:09 AM  PATIENT:  Audrey Chandler  58 y.o. female  PRE-OPERATIVE DIAGNOSIS:  Severe osteoarthritis left hip  POST-OPERATIVE DIAGNOSIS:  Severe osteoarthritis left hip  PROCEDURE:  Procedure(s): LEFT TOTAL HIP ARTHROPLASTY ANTERIOR APPROACH (Left)  SURGEON:  Surgeon(s) and Role:    * Mcarthur Rossetti, MD - Primary  PHYSICIAN ASSISTANT: Benita Stabile, PA-C  ANESTHESIA:   spinal  EBL:  Total I/O In: 2000 [I.V.:2000] Out: 525 [Urine:475; Blood:50]  BLOOD ADMINISTERED:none  DRAINS: none   LOCAL MEDICATIONS USED:  NONE  SPECIMEN:  No Specimen  DISPOSITION OF SPECIMEN:  N/A  COUNTS:  YES  TOURNIQUET:  * No tourniquets in log *  DICTATION: .Other Dictation: Dictation Number [id number]  PLAN OF CARE: Admit to inpatient   PATIENT DISPOSITION:  PACU - hemodynamically stable.   Delay start of Pharmacological VTE agent (>24hrs) due to surgical blood loss or risk of bleeding: no

## 2013-06-20 LAB — BASIC METABOLIC PANEL
BUN: 9 mg/dL (ref 6–23)
CALCIUM: 8.1 mg/dL — AB (ref 8.4–10.5)
CO2: 27 mEq/L (ref 19–32)
Chloride: 100 mEq/L (ref 96–112)
Creatinine, Ser: 0.73 mg/dL (ref 0.50–1.10)
GFR calc Af Amer: >90 mL/min (ref >90)
GFR calc non Af Amer: >90 mL/min (ref >90)
GLUCOSE: 123 mg/dL — AB (ref 70–99)
Potassium: 3.9 mEq/L (ref 3.7–5.3)
SODIUM: 136 meq/L — AB (ref 137–147)

## 2013-06-20 LAB — CBC
HCT: 35.4 % — ABNORMAL LOW (ref 36.0–46.0)
Hemoglobin: 12 g/dL (ref 12.0–15.0)
MCH: 29.8 pg (ref 26.0–34.0)
MCHC: 33.9 g/dL (ref 30.0–36.0)
MCV: 87.8 fL (ref 78.0–100.0)
Platelets: 159 10*3/uL (ref 150–400)
RBC: 4.03 MIL/uL (ref 3.87–5.11)
RDW: 12.6 % (ref 11.5–15.5)
WBC: 6.4 10*3/uL (ref 4.0–10.5)

## 2013-06-20 MED ORDER — DOXYCYCLINE HYCLATE 100 MG PO TABS
100.0000 mg | ORAL_TABLET | Freq: Every day | ORAL | Status: DC
  Administered 2013-06-20 – 2013-06-21 (×2): 100 mg via ORAL
  Filled 2013-06-20 (×3): qty 1

## 2013-06-20 NOTE — Progress Notes (Signed)
Subjective: 1 Day Post-Op Procedure(s) (LRB): LEFT TOTAL HIP ARTHROPLASTY ANTERIOR APPROACH (Left) Patient reports pain as moderate.    Objective: Vital signs in last 24 hours: Temp:  [97.6 F (36.4 C)-100.1 F (37.8 C)] 98.6 F (37 C) (06/06 0510) Pulse Rate:  [58-95] 92 (06/06 0510) Resp:  [12-18] 16 (06/06 0510) BP: (90-156)/(55-94) 132/81 mmHg (06/06 0510) SpO2:  [97 %-100 %] 100 % (06/06 0510) Weight:  [116.5 kg (256 lb 13.4 oz)] 116.5 kg (256 lb 13.4 oz) (06/05 1354)  Intake/Output from previous day: 06/05 0701 - 06/06 0700 In: 4696.3 [P.O.:920; I.V.:3666.3; IV Piggyback:110] Out: 3600 [Urine:3550; Blood:50] Intake/Output this shift: Total I/O In: 240 [P.O.:240] Out: -    Recent Labs  06/20/13 0534  HGB 12.0    Recent Labs  06/20/13 0534  WBC 6.4  RBC 4.03  HCT 35.4*  PLT 159    Recent Labs  06/20/13 0534  NA 136*  K 3.9  CL 100  CO2 27  BUN 9  CREATININE 0.73  GLUCOSE 123*  CALCIUM 8.1*   No results found for this basename: LABPT, INR,  in the last 72 hours  Sensation intact distally Intact pulses distally Dorsiflexion/Plantar flexion intact Incision: dressing C/D/I  Assessment/Plan: 1 Day Post-Op Procedure(s) (LRB): LEFT TOTAL HIP ARTHROPLASTY ANTERIOR APPROACH (Left) Up with therapy  Mcarthur Rossetti 06/20/2013, 9:45 AM

## 2013-06-20 NOTE — Progress Notes (Signed)
CARE MANAGEMENT NOTE 06/20/2013  Patient:  Audrey Chandler, Audrey Chandler   Account Number:  [id number]  Date Initiated:  06/20/2013  Documentation initiated by:  [facility]  Subjective/Objective Assessment:   LEFT TOTAL HIP ARTHROPLASTY ANTERIOR APPROACH     Action/Plan:   [place]   Anticipated DC Date:  06/22/2013   Anticipated DC Plan:  [place]  CM consult      [facility] Choice  HOME HEALTH   Choice offered to / List presented to:  [name]   DME arranged  3-N-1  Vassie Moselle      DME agency  [place] arranged  HH-2 PT      [place]   Status of service:  Completed, signed off Medicare Important Message given?   (If response is "NO", the following Medicare IM given date fields will be blank) Date Medicare IM given:   Date Additional Medicare IM given:    Discharge Disposition:  [place]  Per UR Regulation:    If discussed at Long Length of Stay Meetings, dates discussed:    Comments:  06/20/2013 1430 NCM spoke to pt and offered choice for [facility]. Pt agreeable to [facility] for [facility]. Pt requested RW and 3n1 for home. Notified [facility] for DME for home. Jonnie Finner RN CCM Case Mgmt phone [phone]

## 2013-06-20 NOTE — Progress Notes (Signed)
Pt T 102.1 encouraged use of IS which she did, T immediately 100.7. Will continue to monitor. Pt verbalized understanding that she needs to use q 1h. No cough. No  increased pain or other c/o. Will make MD aware in am.

## 2013-06-20 NOTE — Evaluation (Signed)
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   Clinical Impression   This 58 year old female is s/p above surgery.  Prior to admission, she was independent with adls.  Husband will assist as needed. Will follow in acute for bathroom transfers and to determine if 3:1 commode is needed for night use. Pt has an elevated toilet seat with rails.      Follow Up Recommendations  Home health OT    Equipment Recommendations   (to be further assessed;  pt has elevated toilet seat/rails)    Recommendations for Other Services       Precautions / Restrictions Precautions Precautions: Fall Restrictions Weight Bearing Restrictions: Yes LLE Weight Bearing: Weight bearing as tolerated      Mobility Bed Mobility              Transfers Overall transfer level: Needs assistance   Transfers: Sit to/from Stand Sit to Stand: Min assist;+2 physical assistance         General transfer comment: cues for hand/foot placement    Balance Overall balance assessment: Needs assistance           Standing balance-Leahy Scale: Poor                              ADL Overall ADL's : Needs assistance/impaired     Grooming: Set up;Sitting   Upper Body Bathing: Sitting;Set up   Lower Body Bathing: +2 for physical assistance;Maximal assistance   Upper Body Dressing : Set up;Sitting   Lower Body Dressing: +2 for physical assistance;Maximal assistance;Sit to/from stand                 General ADL Comments: Pt stood with A x 2--husband assisted as she felt better with second person.  C/O stinging pain that doesn't go away when she stands and also when she contracts muscle scooting back in chair.  Pt has a reacher:  described uses.  She may want a poof on a long handle for bathing.       Vision                      Perception     Praxis      Pertinent Vitals/Pain C/o stinging pain when standing/scooting.  repositioned     Hand Dominance Right   Extremity/Trunk Assessment Upper Extremity Assessment Upper Extremity Assessment: Generalized weakness (R 4-/5; L 4/5) does not want exercise band:  Will strengthen functionally with transfers          Communication Communication Communication: No difficulties   Cognition Arousal/Alertness: Awake/alert Behavior During Therapy: WFL for tasks assessed/performed Overall Cognitive Status: Within Functional Limits for tasks assessed                     General Comments       Exercises       Shoulder Instructions      Home Living Family/patient expects to be discharged to:: Private residence Living Arrangements: Spouse/significant other Available Help at Discharge: Family;Available 24 hours/day Type of Home: House Home Access: Stairs to enter [employer] of Steps: 5 Entrance Stairs-Rails: Left Home Layout: One level     Bathroom Shower/Tub: Occupational psychologist: Standard     Home Equipment: [employer] -  single point;Shower seat;Toilet riser   Additional Comments: toilet riser has arms.  shower seat not level in shower:  will have [place] look at this      Prior Functioning/Environment Level of Independence: Independent with assistive device(s);Needs assistance  Gait / Transfers Assistance Needed: Amb with cane ADL's / Homemaking Assistance Needed: A with socks   Comments: I with use of cane    OT Diagnosis: Generalized weakness   OT Problem List: Decreased strength;Decreased activity tolerance;Decreased knowledge of use of DME or AE;Decreased knowledge of precautions;Pain   OT Treatment/Interventions: Self-care/ADL training;DME and/or AE instruction;Patient/family education    OT Goals(Current goals can be found in the care plan section) Acute Rehab OT Goals Patient Stated Goal: To get  stronger OT Goal Formulation: With patient Time For Goal Achievement: 06/27/13 Potential to Achieve Goals: Good ADL Goals Pt Will Transfer to Toilet: with min guard assist;ambulating;bedside commode Pt Will Perform Toileting - Clothing Manipulation and hygiene: with min guard assist;sit to/from stand Pt Will Perform Tub/Shower Transfer: with min guard assist;Shower transfer;ambulating;shower seat  OT Frequency: Min 2X/week   Barriers to D/C:            Co-evaluation              End of Session    Activity Tolerance: Patient limited by pain;Patient limited by fatigue Patient left: in chair;with call bell/phone within reach;with family/visitor present   Time: 4196-2229 OT Time Calculation (min): 19 min Charges:  OT General Charges $OT Visit: 1 Procedure OT Evaluation $Initial OT Evaluation Tier I: 1 Procedure OT Treatments $Therapeutic Activity: 8-22 mins G-Codes:    Lesle Chris 2013-06-27, 12:02 PM Lesle Chris, OTR/L [phone] 2013/06/27

## 2013-06-20 NOTE — Progress Notes (Signed)
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    PT Comments    Pt making good progress and should continue to progress well with PT with d/c plan of home with HHPT and husband support.  Follow Up Recommendations  Home health PT     Equipment Recommendations  Rolling walker with 5" wheels    Recommendations for Other Services       Precautions / Restrictions Precautions Precautions: Fall Restrictions LLE Weight Bearing: Weight bearing as tolerated    Mobility  Bed Mobility                  Transfers   Equipment used: Rolling walker (2 wheeled) Transfers: Sit to/from Stand Sit to Stand: Min assist         General transfer comment: cues for hand/foot placement  Ambulation/Gait Ambulation/Gait assistance: Min assist Ambulation Distance (Feet): 25 Feet Assistive device: Rolling walker (2 wheeled) Gait Pattern/deviations: Step-to pattern;Decreased step length - right Gait velocity: decreased   General Gait Details: Pt took 1 standing rest break. Cueing for proper distance of RW to not step past front of RW.   Stairs            Wheelchair Mobility    Modified Rankin (Stroke Patients Only)       Balance                                    Cognition Arousal/Alertness: Awake/alert Behavior During Therapy: WFL for tasks assessed/performed Overall Cognitive Status: Within Functional Limits for tasks assessed                      Exercises      General Comments        Pertinent Vitals/Pain 1/10 L LE    Home Living                      Prior Function            PT Goals (current goals can now be found in the care plan section) Progress towards PT goals: Progressing toward goals    Frequency  7X/week    PT Plan Current plan remains appropriate     Co-evaluation             End of Session Equipment Utilized During Treatment: Gait belt Activity Tolerance: Patient tolerated treatment well Patient left: in chair;with family/visitor present     Time: 3500-9381 PT Time Calculation (min): 11 min  Charges:  $Gait Training: 8-22 mins                    G Codes:      Galen Manila 07-Jul-2013, 3:09 PM

## 2013-06-20 NOTE — Op Note (Signed)
NAMEDWAYNE, Audrey Chandler NO.:  [id number]  MEDICAL RECORD NO.:  [id number]  LOCATION:  1027                         FACILITY:  [facility]  PHYSICIAN:  Lind Guest. Ninfa Linden, M.D.DATE OF BIRTH:  1955/01/24  DATE OF PROCEDURE:  06/19/2013 DATE OF DISCHARGE:                              OPERATIVE REPORT   PREOPERATIVE DIAGNOSIS:  Severe end-stage arthritis and degenerative joint disease, left hip.  POSTOPERATIVE DIAGNOSIS:  Severe end-stage arthritis and degenerative joint disease, left hip.  PROCEDURE:  Left total hip arthroplasty using direct anterior approach.  IMPLANTS:  DePuy Sector Gription acetabular component size 52 with apex hole eliminator guide and a single screw, size 36+ 4 neutral polyethylene liner, size 12 Corail femoral component with standard offset, size 36+ 5 ceramic hip ball.  SURGEON:  Lind Guest. Ninfa Linden, M.D.  ASSISTANT:  Erskine Emery, PA-C  ANESTHESIA:  Spinal.  ANTIBIOTICS:  2 g of IV Ancef.  BLOOD LOSS:  100 mL.  COMPLICATIONS:  None.  INDICATIONS:  Ms. Matthes is a very pleasant, morbidly obese 58 year old female with severe debilitating pain involving her left hip and known well documented end-stage arthritis.  Her x-rays show complete loss of joint space on the left side, periarticular osteophytes, and sclerotic changes.  Her pain is now daily.  Her mobility is limited and her quality of life has been greatly diminished due to her hip pain.  At this point, she wished to proceed with a total hip arthroplasty.  She understands the risk of acute blood loss anemia, nerve and vessel injury, infection, fracture, dislocation, DVT.  She understands the goals are decreased pain, improved mobility, and overall improved quality of life.  PROCEDURE DESCRIPTION:  After informed consent was obtained, the appropriate left hip was marked.  She was brought to the operating room. Spinal anesthesia was obtained while she was on her stretcher.   Then, she was next laid back on her stretcher supine and a Foley catheter was placed and both feet had traction boots applied to them.  Next, she was placed supine on the HANA fracture table with perineal post in place and both legs in inline skeletal traction devices, but no traction applied. We then prepped and draped the left hip with DuraPrep and sterile drapes.  A time-out was called and she was identified as correct patient, correct left hip.  We then made an incision just inferior and posterior to the anterosuperior iliac spine and carried this obliquely down the leg.  I dissected down to the soft tissues and substantial adipose tissue to the tensor fascia lata.  The tensor fascia was then divided longitudinally and we proceeded with a direct anterior approach to the hip.  We cauterized the lateral femoral circumflex vessels and placed Cobra retractors around the medial neck and up underneath the rectus femoris and around the lateral neck.  I opened up the hip capsule in an L-type format and found a large joint effusion.  Placed Cobra retractors within the hip capsule.  I then made my femoral neck cut proximal to the lesser trochanter and completed this with an osteotome. I placed a corkscrew guide in the femoral head and removed the femoral head in its entirety  and found it to be devoid of cartilage.  We cleaned the acetabulum of debris including remnants of the acetabular labrum. We placed a bent Hohmann medially and a Cobra retractor laterally.  I then began reaming from a size 42 reamer in 2 mm increments all the way up to a size 52 with the last 2 reamers placed under direct visualization and direct fluoroscopy.  We were able to gain our depth of reaming, our inclination, and anteversion.  Once I was pleased with this, I placed the real DePuy Sector Gription acetabular component size 52 and had a good fit with this.  I placed apex hole eliminator guide in a single screw and  then a 36+ 4 neutral polyethylene liner.  Attention was then turned to the femur.  With the leg externally rotated to 100 degrees, extended and adducted, I placed a Mueller retractor medially and a Hohmann retractor behind the greater trochanter.  I released the lateral joint capsule and used a box cutting osteotome to enter the femoral canal and a rongeur to lateralize.  I then began broaching from a size 8 broach using the Corail femoral broaching system, all the way up to a size 12, the size 12 was felt to be stable, so we trialed a standard neck and a 36+ 1.5 hip ball.  We brought the leg back over and up and with traction and internal rotation, reduced it in the pelvis.  I was pleased with the stability, but it felt like she needed to touch more offset and length.  We dislocated the hip and removed the trial components.  We placed the real Corail femoral component size 12 with standard offset and a 36+ 5 ceramic hip ball, reduced this back in the acetabulum and I was pleased with the leg length and stability.  We then copiously irrigated the soft tissues with normal saline solution.  We closed the joint capsule with interrupted #1 Ethibond suture followed by a running #1 Vicryl in the tensor fascia, 0-Vicryl in the deep tissue, 2- 0 Vicryl in the subcutaneous tissue, staples on the skin and Aquacel dressing.  She was taken off the HANA table and taken to the recovery room in stable condition.  All final counts were correct and there were no complications noted.  Of note, [employer] PA-C assisted the entire case and her assistance was assistance was crucial for facilitating this case due to her obesity, need for patient positioning, retraction, and layered closure of the wound.     Lind Guest. Ninfa Linden, M.D.     CYB/MEDQ  D:  06/19/2013  T:  06/20/2013  Job:  [id number]

## 2013-06-20 NOTE — Evaluation (Signed)
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  Clinical Impression  Pt admitted with L direct THA. Pt currently with functional limitations due to the deficits listed below (see PT Problem List).  Pt will benefit from skilled PT to increase their independence and safety with mobility to allow discharge to the venue listed below. Pt will have A from husband when d/c home.      Follow Up Recommendations Home health PT    Equipment Recommendations  Rolling walker with 5" wheels    Recommendations for Other Services       Precautions / Restrictions Restrictions Weight Bearing Restrictions: Yes LLE Weight Bearing: Weight bearing as tolerated      Mobility  Bed Mobility Overal bed mobility: Needs Assistance;+2 for physical assistance Bed Mobility: Supine to Sit     Supine to sit: Mod assist     General bed mobility comments: Pulled on husband's arm for leverage. Cues to push through UE to increase Independence.  Transfers Overall transfer level: Needs assistance   Transfers: Sit to/from Stand Sit to Stand: Min assist;+2 physical assistance         General transfer comment: cues for hand/foot placement  Ambulation/Gait Ambulation/Gait assistance: Min assist;+2 physical assistance Ambulation Distance (Feet): 10 Feet   Gait Pattern/deviations: Step-to pattern;Antalgic Gait velocity: decreased   General Gait Details: Pt c/o L thigh nerve type pain.  Pt took 2 standing rest breaks to complete 10 feet of gait.  Improved steps by end of gait.  Stairs            Wheelchair Mobility    Modified Rankin (Stroke Patients Only)       Balance Overall balance assessment: Needs assistance           Standing balance-Leahy Scale: Poor                               Pertinent Vitals/Pain 3/10 L LE     Home Living Family/patient expects to be discharged to:: Private residence Living Arrangements: Spouse/significant other Available Help at Discharge: Family;Available 24 hours/day Type of Home: House Home Access: Stairs to enter Entrance Stairs-Rails: Left Entrance Stairs-Number of Steps: 5 Home Layout: One level Home Equipment: Cane - single point;Shower seat;Toilet riser      Prior Function Level of Independence: Independent with assistive device(s);Needs assistance   Gait / Transfers Assistance Needed: Amb with cane  ADL's / Homemaking Assistance Needed: A with socks  Comments: I with use of cane     Hand Dominance   Dominant Hand: Right    Extremity/Trunk Assessment   Upper Extremity Assessment: Defer to OT evaluation           Lower Extremity Assessment: LLE deficits/detail   LLE Deficits / Details: Limited ROM due to pain.  Fair(-) quad set.     Communication   Communication: No difficulties  Cognition Arousal/Alertness: Awake/alert Behavior During Therapy: WFL for tasks assessed/performed Overall Cognitive Status: Within Functional Limits for tasks assessed                      General Comments      Exercises Total Joint Exercises Ankle Circles/Pumps: AROM;Both;10 reps;Supine Quad Sets: Strengthening;Left;10 reps;Supine Heel Slides: AAROM;Left;10 reps;Supine Hip ABduction/ADduction: AAROM;Left;10 reps;Supine      Assessment/Plan  PT Assessment Patient needs continued PT services  PT Diagnosis     PT Problem List Decreased strength;Decreased range of motion;Decreased activity tolerance;Decreased mobility;Decreased knowledge of use of DME  PT Treatment Interventions DME instruction;Gait training;Stair training;Functional mobility training;Therapeutic activities;Therapeutic exercise   PT Goals (Current goals can be found in the Care Plan section) Acute Rehab PT Goals Patient Stated Goal: To get stronger PT Goal Formulation: With  patient Time For Goal Achievement: 06/27/13 Potential to Achieve Goals: Good    Frequency 7X/week   Barriers to discharge        Co-evaluation               End of Session Equipment Utilized During Treatment: Gait belt Activity Tolerance: Patient limited by pain Patient left: in chair;with family/visitor present Nurse Communication: Mobility status;Patient requests pain meds         Time: 1001-1029 PT Time Calculation (min): 28 min   Charges:   PT Evaluation $Initial PT Evaluation Tier I: 1 Procedure PT Treatments $Gait Training: 8-22 mins   PT G Codes:          Galen Manila July 03, 2013, 10:40 AM

## 2013-06-21 LAB — CBC
HCT: 36.1 % (ref 36.0–46.0)
HEMOGLOBIN: 12.4 g/dL (ref 12.0–15.0)
MCH: 30.3 pg (ref 26.0–34.0)
MCHC: 34.3 g/dL (ref 30.0–36.0)
MCV: 88.3 fL (ref 78.0–100.0)
PLATELETS: 140 10*3/uL — AB (ref 150–400)
RBC: 4.09 MIL/uL (ref 3.87–5.11)
RDW: 12.7 % (ref 11.5–15.5)
WBC: 7.6 10*3/uL (ref 4.0–10.5)

## 2013-06-21 MED ORDER — ASPIRIN 325 MG PO TBEC: 325.0000 mg | DELAYED_RELEASE_TABLET | Freq: Two times a day (BID) | ORAL | Status: DC

## 2013-06-21 MED ORDER — OXYCODONE-ACETAMINOPHEN 5-325 MG PO TABS: 1.0000 | ORAL_TABLET | ORAL | Status: DC | PRN

## 2013-06-21 MED ORDER — METHOCARBAMOL 500 MG PO TABS: 500.0000 mg | ORAL_TABLET | Freq: Four times a day (QID) | ORAL | Status: DC | PRN

## 2013-06-21 NOTE — Progress Notes (Signed)
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    PT Comments    POD # 2 amb in hallway then assisted to recliner to perform THR TE's.  Pt plans to D/C to home tomorrow.  Follow Up Recommendations  Home health PT     Equipment Recommendations  Rolling walker with 5" wheels    Recommendations for Other Services       Precautions / Restrictions Precautions Precautions: Fall Restrictions LLE Weight Bearing: Weight bearing as tolerated    Mobility  Bed Mobility               General bed mobility comments: Pt OOB in recliner  Transfers Overall transfer level: Needs assistance Equipment used: Rolling walker (2 wheeled) Transfers: Sit to/from Stand Sit to Stand: Supervision;Min guard         General transfer comment: cues for hand placement and increased time  Ambulation/Gait Ambulation/Gait assistance: Min guard Ambulation Distance (Feet): 55 Feet Assistive device: Rolling walker (2 wheeled) Gait Pattern/deviations: Step-to pattern Gait velocity: decreased   General Gait Details: increased time.    Stairs            Wheelchair Mobility    Modified Rankin (Stroke Patients Only)       Balance                                    Cognition                            Exercises   Total Hip Replacement TE's 10 reps ankle pumps 10 reps knee presses 10 reps heel slides 10 reps SAQ's 10 reps ABD Followed by ICE     General Comments        Pertinent Vitals/Pain C/o "soreness'    Home Living                      Prior Function            PT Goals (current goals can now be found in the care plan section) Progress towards PT goals: Progressing toward goals    Frequency       PT Plan Current plan remains appropriate    Co-evaluation             End of Session Equipment Utilized During Treatment: Gait belt Activity Tolerance: Patient tolerated treatment well Patient left: in chair;with family/visitor present     Time: 0835-0900 PT Time Calculation (min): 25 min  Charges:  $Gait Training: 8-22 mins $Therapeutic Activity: 8-22 mins                    G Codes:      Rica Koyanagi  PTA [facility]  Acute  Rehab Pager      [phone]

## 2013-06-21 NOTE — Discharge Instructions (Signed)
Pickup stool softener for constipation. Weight Bearing as tolerated  No hip precautions Progress activities slowly Expect hip and possible knee soreness and swelling. Apply heat or ice as needed. Keep dressing clean dry and intact, may shower with dressing intact. On Friday remove dressing, incision can get wet. After showering apply clean dressing.

## 2013-06-21 NOTE — Progress Notes (Signed)
Subjective: 2 Days Post-Op Procedure(s) (LRB): LEFT TOTAL HIP ARTHROPLASTY ANTERIOR APPROACH (Left) Patient reports pain as 4 on 0-10 scale.    Objective: Vital signs in last 24 hours: Temp:  [98.3 F (36.8 C)-102.1 F (38.9 C)] 98.6 F (37 C) (06/07 0645) Pulse Rate:  [95-106] 96 (06/07 0645) Resp:  [16-18] 16 (06/07 0645) BP: (121-141)/(72-82) 121/72 mmHg (06/07 0645) SpO2:  [94 %-98 %] 94 % (06/07 0645)  Intake/Output from previous day: 06/06 0701 - 06/07 0700 In: 1818 [P.O.:960; I.V.:858] Out: 5350 [Urine:5350] Intake/Output this shift:     Recent Labs  06/20/13 0534 06/21/13 0442  HGB 12.0 12.4    Recent Labs  06/20/13 0534 06/21/13 0442  WBC 6.4 7.6  RBC 4.03 4.09  HCT 35.4* 36.1  PLT 159 140*    Recent Labs  06/20/13 0534  NA 136*  K 3.9  CL 100  CO2 27  BUN 9  CREATININE 0.73  GLUCOSE 123*  CALCIUM 8.1*   No results found for this basename: LABPT, INR,  in the last 72 hours  Neurologically intact  Assessment/Plan: 2 Days Post-Op Procedure(s) (LRB): LEFT TOTAL HIP ARTHROPLASTY ANTERIOR APPROACH (Left) Up with therapy  . Ambulating in hall slowly with PT, going to do stairs today. Home in AM.  Has slow gait pain and fatigue. Hgb stable. D/c  SL  Audrey Chandler 06/21/2013, 8:48 AM

## 2013-06-21 NOTE — Plan of Care (Signed)
Problem: Phase III Progression Outcomes Goal: Anticoagulant follow-up in place Outcome: Not Applicable Date Met:  91/67/56 asa

## 2013-06-21 NOTE — Progress Notes (Signed)
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   OT comments  Pt progressing well and will not need further OT.  Follow Up Recommendations  No OT follow up    Equipment Recommendations  None recommended by OT    Recommendations for Other Services      Precautions / Restrictions Precautions Precautions: Fall Restrictions LLE Weight Bearing: Weight bearing as tolerated       Mobility Bed Mobility         Supine to sit: Min assist     General bed mobility comments: practiced from flat bed; tried without rail, but pt briefly reached for rail behind her.  Transfers     Transfers: Sit to/from Stand Sit to Stand: Min guard         General transfer comment: cues for hand placement    Balance                                   ADL                           Toilet Transfer: Min guard;Ambulation;BSC   Toileting- Water quality scientist and Hygiene: Min guard;Sit to/from stand         General ADL Comments: pt ambulated to bathroom with 3:1 over toilet. She has an elevated toilet seat and this should work fine for her.  Demonstrated and educated on shower transfer:  she will practice at home with [place].      Vision                     Perception     Praxis      Cognition   Behavior During Therapy: [facility] for tasks assessed/performed Overall Cognitive Status: Within Functional Limits for tasks assessed                       Extremity/Trunk Assessment               Exercises     Shoulder Instructions       General Comments      Pertinent Vitals/ Pain       Minimal pain with weight bearing.  Repositioned with ice  Home Living                                          Prior Functioning/Environment              Frequency        Progress Toward Goals  OT Goals(current goals can now be found in the care plan section)  Progress towards OT goals: Goals met/education completed, patient discharged from OT (verbalizes understanding of shower:  did not practice)     Plan      Co-evaluation                 End of Session     Activity Tolerance Patient limited by fatigue   Patient Left in chair;with call bell/phone within reach   Nurse Communication          Time: 8811-0315 OT Time Calculation (min): 21 min  Charges: OT General Charges $OT Visit: 1 Procedure OT Treatments $Self Care/Home Management : 8-22 mins  [facility] 06/21/2013, 8:18 AM  Lesle Chris, OTR/L [phone] 06/21/2013

## 2013-06-22 LAB — CBC
HEMATOCRIT: 33.8 % — AB (ref 36.0–46.0)
HEMOGLOBIN: 11.4 g/dL — AB (ref 12.0–15.0)
MCH: 29.8 pg (ref 26.0–34.0)
MCHC: 33.7 g/dL (ref 30.0–36.0)
MCV: 88.5 fL (ref 78.0–100.0)
Platelets: 139 10*3/uL — ABNORMAL LOW (ref 150–400)
RBC: 3.82 MIL/uL — ABNORMAL LOW (ref 3.87–5.11)
RDW: 12.6 % (ref 11.5–15.5)
WBC: 7.6 10*3/uL (ref 4.0–10.5)

## 2013-06-22 NOTE — Progress Notes (Signed)
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    PT Comments    POD # 3 pt ready for D/c to home.  Practiced stairs, amb in hallway then instructed on HEP alonf with use of ICE.  Instructed on proper tech to get in/ot car.    Follow Up Recommendations  Home health PT     Equipment Recommendations       Recommendations for Other Services       Precautions / Restrictions Precautions Precautions: Fall Restrictions LLE Weight Bearing: Weight bearing as tolerated    Mobility  Bed Mobility               General bed mobility comments: Pt OOB in recliner  Transfers Overall transfer level: Modified independent Equipment used: Rolling walker (2 wheeled) Transfers: Sit to/from Stand Sit to Stand: Modified independent (Device/Increase time)         General transfer comment: good safety tech   Ambulation/Gait Ambulation/Gait assistance: Supervision Ambulation Distance (Feet): 56 Feet Assistive device: Rolling walker (2 wheeled) Gait Pattern/deviations: Step-to pattern Gait velocity: decreased   General Gait Details: increased time.    Stairs Stairs: Yes Stairs assistance: Min assist Stair Management: One rail Left;Step to pattern;Forwards;With crutches Number of Stairs: 4 General stair comments: with spouse present  Wheelchair Mobility    Modified Rankin (Stroke Patients Only)       Balance                                    Cognition                            Exercises      General Comments        Pertinent Vitals/Pain C/o "soreness" Applied ICE    Home Living                      Prior Function            PT Goals (current goals can now be found in the care plan section) Progress towards PT goals: Progressing toward goals    Frequency   7X/week    PT Plan      Co-evaluation             End of Session Equipment Utilized During Treatment: Gait belt Activity Tolerance: Patient tolerated treatment well Patient left: in chair;with family/visitor present     Time: 1120-1145 PT Time Calculation (min): 25 min  Charges:  $Gait Training: 8-22 mins $Self Care/Home Management: 8-22                    G Codes:      Rica Koyanagi  PTA [facility]  Acute  Rehab Pager      [phone]

## 2013-06-22 NOTE — Discharge Summary (Signed)
Patient ID: Audrey Chandler MRN: [id number] DOB/AGE: 1955/04/06 58 y.o.  Admit date: 06/19/2013 Discharge date: 06/22/2013  Admission Diagnoses:  Principal Problem:   Arthritis of left hip Active Problems:   Status post THR (total hip replacement)   Discharge Diagnoses:  Same  Past Medical History  Diagnosis Date  . Vitamin D deficiency   . Seizure disorder 2010  . Thyroid disease 06/2008    hypothyroidism  . Closed fracture dislocation of multiple fingers 7th grade    left hand 4th & 5th fingers  . Fracture of wrist, closed 6th grade    right  . Hypothyroidism   . Seizures   . Arthritis     Surgeries: Procedure(s): LEFT TOTAL HIP ARTHROPLASTY ANTERIOR APPROACH on 06/19/2013   Consultants:    Discharged Condition: Improved  Hospital Course: Audrey Chandler is an 58 y.o. female who was admitted 06/19/2013 for operative treatment ofArthritis of left hip. Patient has severe unremitting pain that affects sleep, daily activities, and work/hobbies. After pre-op clearance the patient was taken to the operating room on 06/19/2013 and underwent  Procedure(s): LEFT TOTAL HIP ARTHROPLASTY ANTERIOR APPROACH.    Patient was given perioperative antibiotics: Anti-infectives   Start     Dose/Rate Route Frequency Ordered Stop   06/20/13 1036  doxycycline (VIBRA-TABS) tablet 100 mg     100 mg Oral Daily at 10 pm 06/20/13 1037     06/19/13 2200  doxycycline (VIBRA-TABS) tablet 100 mg  Status:  Discontinued     100 mg Oral Daily 06/19/13 1425 06/20/13 1037   06/19/13 1600  ceFAZolin (ANCEF) IVPB 2 g/50 mL premix     2 g 100 mL/hr over 30 Minutes Intravenous Every 6 hours 06/19/13 1425 06/19/13 2256   06/19/13 0756  ceFAZolin (ANCEF) IVPB 2 g/50 mL premix     2 g 100 mL/hr over 30 Minutes Intravenous On call to O.R. 06/19/13 4696 06/19/13 2952       Patient was given sequential compression devices, early ambulation, and chemoprophylaxis to prevent DVT.  Patient benefited maximally from  hospital stay and there were no complications.    Recent vital signs: Patient Vitals for the past 24 hrs:  BP Temp Temp src Pulse Resp SpO2  06/22/13 0548 123/67 mmHg 99.6 F (37.6 C) Oral 94 18 97 %  06/21/13 2111 120/74 mmHg 98.9 F (37.2 C) Oral 90 17 97 %  06/21/13 1730 - 99.6 F (37.6 C) - - - -  06/21/13 1435 138/81 mmHg 100.6 F (38.1 C) Oral 93 16 95 %     Recent laboratory studies:  Recent Labs  06/20/13 0534 06/21/13 0442 06/22/13 0455  WBC 6.4 7.6 7.6  HGB 12.0 12.4 11.4*  HCT 35.4* 36.1 33.8*  PLT 159 140* 139*  NA 136*  --   --   K 3.9  --   --   CL 100  --   --   CO2 27  --   --   BUN 9  --   --   CREATININE 0.73  --   --   GLUCOSE 123*  --   --   CALCIUM 8.1*  --   --      Discharge Medications:     Medication List         aspirin 325 MG EC tablet  Take 1 tablet (325 mg total) by mouth 2 (two) times daily after a meal.     celecoxib 200 MG capsule  Commonly known as:  CELEBREX  Take 200 mg by mouth daily.     clobetasol 0.05 % topical foam  Commonly known as:  OLUX  Apply 1 application topically 2 (two) times daily as needed (rosacea).     doxycycline 100 MG tablet  Commonly known as:  VIBRA-TABS  Take 100 mg by mouth daily.     lamoTRIgine 100 MG tablet  Commonly known as:  LAMICTAL  Take 100 mg by mouth 2 (two) times daily.     methocarbamol 500 MG tablet  Commonly known as:  ROBAXIN  Take 1 tablet (500 mg total) by mouth every 6 (six) hours as needed for muscle spasms.     metroNIDAZOLE 0.75 % gel  Commonly known as:  METROGEL  Apply 1 application topically daily.     oxyCODONE-acetaminophen 5-325 MG per tablet  Commonly known as:  ROXICET  Take 1-2 tablets by mouth every 4 (four) hours as needed for severe pain.     SYNTHROID 75 MCG tablet  Generic drug:  levothyroxine  Take 75 mcg by mouth daily before breakfast.     Vitamin D (Ergocalciferol) 50000 UNITS Caps capsule  Commonly known as:  DRISDOL  Take 50,000 Units by  mouth every 14 (fourteen) days.        Diagnostic Studies: Dg Hip Complete Left  06/19/2013   CLINICAL DATA:  Left total hip replacement.  EXAM: LEFT HIP - COMPLETE 2+ VIEW  COMPARISON:  12/29/2012  FINDINGS: 2 intraoperative spot images demonstrate changes of left hip replacement. Normal AP alignment. No hardware or bony complicating feature.  IMPRESSION: Left hip replacement without complicating feature.   Electronically Signed   By: Rolm Baptise M.D.   On: 06/19/2013 11:24   Dg Pelvis Portable  06/19/2013   CLINICAL DATA:  Status post left hip arthroplasty.  EXAM: PORTABLE PELVIS 1-2 VIEWS  COMPARISON:  Intraoperative imaging earlier today.  FINDINGS: Frontal projection of the pelvis shows normal alignment of a total left hip arthroplasty. No fracture or abnormal lucency is identified.  IMPRESSION: Normal alignment status post left hip arthroplasty.   Electronically Signed   By: Aletta Edouard M.D.   On: 06/19/2013 12:30   Dg Hip Portable 1 View Left  06/19/2013   CLINICAL DATA:  Left hip replacement.  EXAM: PORTABLE LEFT HIP - 1 VIEW  COMPARISON:  Intraoperative imaging 06/19/2013  FINDINGS: Cross-table lateral view of the left hip demonstrates changes of hip replacement. No hardware or bony complicating feature. Normal alignment.  IMPRESSION: Left hip replacement without complicating feature.   Electronically Signed   By: Rolm Baptise M.D.   On: 06/19/2013 12:17   Dg C-arm 1-60 Min-no Report  06/19/2013   CLINICAL DATA: intraop   C-ARM 1-60 MINUTES  Fluoroscopy was utilized by the requesting physician.  No radiographic  interpretation.     Disposition: to home      Discharge Instructions   Call MD / Call 911    Complete by:  As directed   If you experience chest pain or shortness of breath, CALL 911 and be transported to the hospital emergency room.  If you develope a fever above 101 F, pus (white drainage) or increased drainage or redness at the wound, or calf pain, call your surgeon's  office.     Constipation Prevention    Complete by:  As directed   Drink plenty of fluids.  Prune juice may be helpful.  You may use a stool softener, such as Colace (over the counter) 100 mg  twice a day.  Use MiraLax (over the counter) for constipation as needed.     Diet - low sodium heart healthy    Complete by:  As directed      Discharge patient    Complete by:  As directed      Discharge wound care:    Complete by:  As directed   Keep dressing clean and intact. May shower with dressing intact . Remove dressing Friday and shower. After showering apply clean dressing.     Increase activity slowly as tolerated    Complete by:  As directed      Weight bearing as tolerated    Complete by:  As directed   Weight bearing as tolerated no hip precautions           Follow-up Information   Follow up with [facility]. [facility])    Contact information:   [address] [place] [address] [phone]       Follow up with Mcarthur Rossetti, MD In 2 weeks.   Specialty:  Orthopedic Surgery   Contact information:   [place] [address] [phone]        Signed: Mcarthur Rossetti 06/22/2013, 7:02 AM

## 2013-06-22 NOTE — Progress Notes (Signed)
Patient ID: Audrey Chandler, female   DOB: 1955/01/24, 58 y.o.   MRN: [id number] Looks good.  Incision with scant drainage. Dressing changed.  NVI.  Can go home today.

## 2013-06-22 NOTE — Progress Notes (Signed)
06/22/13 1135  Reviewed discharge instructions with patient and husband. Both verbalized understanding of discharge instructions.  Copy of discharge instructions and prescriptions given to patient.

## 2013-08-06 ENCOUNTER — Ambulatory Visit: Payer: BC Managed Care – PPO | Admitting: Nurse Practitioner

## 2013-08-25 ENCOUNTER — Ambulatory Visit (INDEPENDENT_AMBULATORY_CARE_PROVIDER_SITE_OTHER): Payer: BC Managed Care – PPO | Admitting: Nurse Practitioner

## 2013-08-25 ENCOUNTER — Encounter: Payer: Self-pay | Admitting: Nurse Practitioner

## 2013-08-25 VITALS — BP 122/82 | HR 76 | Ht 67.0 in | Wt 262.0 lb

## 2013-08-25 DIAGNOSIS — Z0000 Encounter for general adult medical examination without abnormal findings: Secondary | ICD-10-CM

## 2013-08-25 DIAGNOSIS — Z01419 Encounter for gynecological examination (general) (routine) without abnormal findings: Secondary | ICD-10-CM

## 2013-08-25 DIAGNOSIS — Z1211 Encounter for screening for malignant neoplasm of colon: Secondary | ICD-10-CM

## 2013-08-25 LAB — POCT URINALYSIS DIPSTICK
BILIRUBIN UA: NEGATIVE
Blood, UA: NEGATIVE
GLUCOSE UA: NEGATIVE
KETONES UA: NEGATIVE
LEUKOCYTES UA: NEGATIVE
Nitrite, UA: NEGATIVE
Protein, UA: NEGATIVE
Urobilinogen, UA: NEGATIVE
pH, UA: 5

## 2013-08-25 LAB — HEMOGLOBIN A1C
Hgb A1c MFr Bld: 5.5 % (ref <5.7)
Mean Plasma Glucose: 111 mg/dL (ref <117)

## 2013-08-25 LAB — HEMOGLOBIN, FINGERSTICK: HEMOGLOBIN, FINGERSTICK: 13.5 g/dL (ref 12.0–16.0)

## 2013-08-25 NOTE — Progress Notes (Signed)
Patient ID: Audrey Chandler, female   DOB: 28-Jul-1955, 58 y.o.   MRN: [id number] 58 y.o. G4P0013 Married Caucasian Fe here for annual exam.  Left THR done 06/19/13 and doing very well.  Some vaso symptoms.  Has some concerns about frequent urination and nocturia.  She has been increasing fluids since surgery and maybe that is the cause.  [place] of DM with PGF.   Patient's last menstrual period was 12/16/2007.          Sexually active: yes   The current method of family planning is vasectomy.     Exercising: no  The patient does not participate in regular exercise at present. Smoker:  no  Health Maintenance: Pap:  08/02/2011 normal with negative HR HPV MMG:  12/05/12, Bi-Rads 1:  Negative Colonoscopy:  07/23/2008 scattered diverticula, recheck in 10 years BMD:   07/26/2010  T Score: spine 3.3/ right hip neck -0.7 / total 0.0 TDaP:  PCP maintains tetanus.   Labs: HB:  13.5  Urine:  Negative     reports that she has quit smoking. She has never used smokeless tobacco. She reports that she drinks about .5 ounces of alcohol per week. She reports that she does not use illicit drugs.  Past Medical History  Diagnosis Date  . Vitamin D deficiency   . Seizure disorder 2010  . Thyroid disease 06/2008    hypothyroidism  . Closed fracture dislocation of multiple fingers 7th grade    left hand 4th & 5th fingers  . Fracture of wrist, closed 6th grade    right  . Hypothyroidism   . Seizures   . Arthritis     Past Surgical History  Procedure Laterality Date  . Vaginal delivery  x3  . Knee arthroscopy Right 2010  . Wisdom tooth extraction  age 42  . Joint replacement    . Total hip arthroplasty Left 06/19/2013    Procedure: LEFT TOTAL HIP ARTHROPLASTY ANTERIOR APPROACH;  Surgeon: Mcarthur Rossetti, MD;  Location: [facility];  Service: Orthopedics;  Laterality: Left;    Current Outpatient Prescriptions  Medication Sig Dispense Refill  . celecoxib (CELEBREX) 200 MG capsule Take 200 mg by mouth daily.       . clobetasol (OLUX) 0.05 % topical foam Apply 1 application topically 2 (two) times daily as needed (rosacea).       . doxycycline (VIBRA-TABS) 100 MG tablet Take 100 mg by mouth daily.      Marland Kitchen lamoTRIgine (LAMICTAL) 100 MG tablet Take 100 mg by mouth 2 (two) times daily.      Marland Kitchen levothyroxine (SYNTHROID) 75 MCG tablet Take 75 mcg by mouth daily before breakfast.      . metroNIDAZOLE (METROGEL) 0.75 % gel Apply 1 application topically daily.       . Vitamin D, Ergocalciferol, (DRISDOL) 50000 UNITS CAPS capsule Take 50,000 Units by mouth every 14 (fourteen) days.       No current facility-administered medications for this visit.    Family History  Problem Relation Age of Onset  . Pulmonary disease Mother   . Heart failure Mother   . Cancer Father     prostate cancer  . Hypertension Father   . Renal Disease Father   . Diabetes Paternal Grandfather   . Bipolar disorder Sister   . Renal Disease Maternal Aunt     ROS:  Pertinent items are noted in HPI.  Otherwise, a comprehensive ROS was negative.  Exam:   BP 122/82  Pulse 76  Ht 5\' 7"  (1.702 m)  Wt 262 lb (118.842 kg)  BMI 41.03 kg/m2  LMP 12/16/2007 Height: 5\' 7"  (170.2 cm)  Ht Readings from Last 3 Encounters:  08/25/13 5\' 7"  (1.702 m)  06/19/13 5\' 8"  (1.727 m)  06/19/13 5\' 8"  (1.727 m)    General appearance: alert, cooperative and appears stated age Head: Normocephalic, without obvious abnormality, atraumatic Neck: no adenopathy, supple, symmetrical, trachea midline and thyroid normal to inspection and palpation Lungs: clear to auscultation bilaterally Breasts: normal appearance, no masses or tenderness Heart: regular rate and rhythm Abdomen: soft, non-tender; no masses,  no organomegaly Extremities: extremities normal, atraumatic, no cyanosis or edema Skin: Skin color, texture, turgor normal. No rashes or lesions Lymph nodes: Cervical, supraclavicular, and axillary nodes normal. No abnormal inguinal nodes  palpated Neurologic: Grossly normal   Pelvic: External genitalia:  no lesions              Urethra:  normal appearing urethra with no masses, tenderness or lesions              Bartholin's and Skene's: normal                 Vagina: normal appearing vagina with normal color and discharge, no lesions              Cervix: anteverted              Pap taken: No. Bimanual Exam:  Uterus:  normal size, contour, position, consistency, mobility, non-tender              Adnexa: no mass, fullness, tenderness               Rectovaginal: Confirms               Anus:  normal sphincter tone, no lesions  A:  Well Woman with normal exam  Postmenopausal - no HRT  S/O left hip replacement 06/19/13  P:   Reviewed health and wellness pertinent to exam  Pap smear not taken today  Mammogram is due 11/15  IFOB is given  Will check HGB [facility]IC and follow - will see PCP this fall  Counseled on breast self exam, mammography screening, osteoporosis, adequate intake of calcium and vitamin D, diet and exercise, Kegel's exercises return annually or prn  An After Visit Summary was printed and given to the patient.

## 2013-08-25 NOTE — Patient Instructions (Signed)

## 2013-08-30 NOTE — Progress Notes (Signed)
Encounter reviewed by Dr. Rameen Quinney Silva.  

## 2013-09-17 ENCOUNTER — Other Ambulatory Visit: Payer: Self-pay | Admitting: Nurse Practitioner

## 2013-09-18 NOTE — Telephone Encounter (Signed)
Last AEX: 08/25/13 Last refill:06/04/13 Current AEX:08/31/14  Please advise

## 2013-09-28 ENCOUNTER — Ambulatory Visit: Payer: BC Managed Care – PPO | Admitting: Nurse Practitioner

## 2013-09-30 ENCOUNTER — Telehealth: Payer: Self-pay | Admitting: Nurse Practitioner

## 2013-09-30 NOTE — Telephone Encounter (Signed)
Pt calling to check on a prescription that was denied.

## 2013-09-30 NOTE — Telephone Encounter (Signed)
Spoke with patient. Patient states that she is calling about her Vitamin D prescription. Advised patient that rx was denied due to it being not appropriate. Patient does not understand why it was not approved. Patient's last vitamin D level was done on 08/04/12. Last aex on 08/25/13.   Audrey Cage, FNP does patient need another Vitamin D level drawn? Please advise.

## 2013-09-30 NOTE — Telephone Encounter (Signed)
I looked back at last Vit D level in 2014 and she was 72 - which would have put her at [place] D.  But it was my mistake in not ordering Vit D this year.  She may choose to come in for a recheck Vit D and then can make further recommendations or get labs done at PCP and send results to [place].  She was going soon anyway to see PCP.  She may also take OTC Vit D 1000 IU daily and just recheck next year.

## 2013-10-02 NOTE — Telephone Encounter (Signed)
Spoke with patient and message from [place] Cage, [place] given.  Patient aware Vitamin D was not drawn.  Patient will discuss with pcp a call back with her choice.  Routing to provider for final review. Patient agreeable to disposition. Will close encounter

## 2013-11-16 ENCOUNTER — Encounter: Payer: Self-pay | Admitting: Nurse Practitioner

## 2014-08-31 ENCOUNTER — Encounter: Payer: Self-pay | Admitting: Nurse Practitioner

## 2014-08-31 ENCOUNTER — Ambulatory Visit: Payer: BC Managed Care – PPO | Admitting: Nurse Practitioner

## 2014-09-03 ENCOUNTER — Encounter: Payer: Self-pay | Admitting: Nurse Practitioner

## 2014-09-03 ENCOUNTER — Ambulatory Visit (INDEPENDENT_AMBULATORY_CARE_PROVIDER_SITE_OTHER): Payer: BLUE CROSS/BLUE SHIELD | Admitting: Nurse Practitioner

## 2014-09-03 VITALS — BP 130/82 | HR 64 | Ht 67.0 in | Wt 253.0 lb

## 2014-09-03 DIAGNOSIS — Z01419 Encounter for gynecological examination (general) (routine) without abnormal findings: Secondary | ICD-10-CM

## 2014-09-03 DIAGNOSIS — R8290 Unspecified abnormal findings in urine: Secondary | ICD-10-CM

## 2014-09-03 DIAGNOSIS — Z0000 Encounter for general adult medical examination without abnormal findings: Secondary | ICD-10-CM | POA: Diagnosis not present

## 2014-09-03 DIAGNOSIS — Z1211 Encounter for screening for malignant neoplasm of colon: Secondary | ICD-10-CM | POA: Diagnosis not present

## 2014-09-03 LAB — POCT URINALYSIS DIPSTICK
Bilirubin, UA: NEGATIVE
Glucose, UA: NEGATIVE
Ketones, UA: NEGATIVE
NITRITE UA: NEGATIVE
PH UA: 6.5
Protein, UA: NEGATIVE
RBC UA: NEGATIVE
UROBILINOGEN UA: NEGATIVE

## 2014-09-03 LAB — HEMOGLOBIN, FINGERSTICK: HEMOGLOBIN, FINGERSTICK: 13.2 g/dL (ref 12.0–16.0)

## 2014-09-03 MED ORDER — ALPRAZOLAM 0.5 MG PO TABS: 0.5000 mg | ORAL_TABLET | Freq: Every evening | ORAL | Status: DC | PRN

## 2014-09-03 MED ORDER — METRONIDAZOLE 1 % EX GEL: Freq: Every day | CUTANEOUS | Status: DC

## 2014-09-03 NOTE — Progress Notes (Signed)
Patient ID: Audrey Chandler, female   DOB: October 03, 1955, 59 y.o.   MRN: [id number] 59 y.o. G2P0013 Married  Caucasian Fe here for annual exam.  She is doing well but recently feeling overwhelmed.  She had a panic attack last week and took the day off.  Her PCP, Dr. Minna Antis has moved away she is going to establish care with another MD.  She would like to have something for just short term to have on hand in case of another panic attack.  She plans to go on vacation tomorrow for a week and knows that will help her.  Patient's last menstrual period was 12/16/2007 (approximate).          Sexually active: Yes.    The current method of family planning is post menopausal status.    Exercising: No.  The patient does not participate in regular exercise at present. Smoker:  no  Health Maintenance: Pap:  08/02/2011, Negative with negative HR HPV MMG:  12/05/12, Bi-Rads 1:  Negative Colonoscopy:  07/23/2008 scattered diverticula, recheck in 10 years BMD:   07/26/2010  T Score: spine 3.3/ right hip neck -0.7 / total 0.0 TDaP:  PCP maintains tetanus.   Labs:  HB:  13.2   Urine:  2+ leuk's   reports that she has quit smoking. She has never used smokeless tobacco. She reports that she drinks about 0.5 oz of alcohol per week. She reports that she does not use illicit drugs.  Past Medical History  Diagnosis Date  . Vitamin D deficiency   . Seizure disorder 2010  . Thyroid disease 06/2008    hypothyroidism  . Closed fracture dislocation of multiple fingers 7th grade    left hand 4th & 5th fingers  . Fracture of wrist, closed 6th grade    right  . Hypothyroidism   . Seizures   . Arthritis     Past Surgical History  Procedure Laterality Date  . Vaginal delivery  x3  . Knee arthroscopy Right 2010  . Wisdom tooth extraction  age 50  . Joint replacement    . Total hip arthroplasty Left 06/19/2013    Procedure: LEFT TOTAL HIP ARTHROPLASTY ANTERIOR APPROACH;  Surgeon: Mcarthur Rossetti, MD;  Location: [facility];  Service: Orthopedics;  Laterality: Left;    Current Outpatient Prescriptions  Medication Sig Dispense Refill  . clobetasol (OLUX) 0.05 % topical foam Apply 1 application topically 2 (two) times daily as needed (rosacea).     . doxycycline (VIBRA-TABS) 100 MG tablet Take 100 mg by mouth daily.    Marland Kitchen lamoTRIgine (LAMICTAL) 100 MG tablet Take 100 mg by mouth 2 (two) times daily.    Marland Kitchen levothyroxine (SYNTHROID) 75 MCG tablet Take 75 mcg by mouth daily before breakfast.    . ALPRAZolam (XANAX) 0.5 MG tablet Take 1 tablet (0.5 mg total) by mouth at bedtime as needed for anxiety. 30 tablet 0  . metroNIDAZOLE (METROGEL) 1 % gel Apply topically daily. 45 g 0   No current facility-administered medications for this visit.    Family History  Problem Relation Age of Onset  . Pulmonary disease Mother   . Heart failure Mother   . Cancer Father     prostate cancer  . Hypertension Father   . Renal Disease Father   . Diabetes Paternal Grandfather   . Bipolar disorder Sister   . Renal Disease Maternal Aunt     ROS:  Pertinent items are noted in HPI.  Otherwise, a comprehensive ROS  was negative.  Exam:   BP 130/82 mmHg  Pulse 64  Ht 5\' 7"  (1.702 m)  Wt 253 lb (114.76 kg)  BMI 39.62 kg/m2  LMP 12/16/2007 (Approximate) Height: 5\' 7"  (170.2 cm) Ht Readings from Last 3 Encounters:  09/03/14 5\' 7"  (1.702 m)  08/25/13 5\' 7"  (1.702 m)  06/19/13 5\' 8"  (1.727 m)    General appearance: alert, cooperative and appears stated age Head: Normocephalic, without obvious abnormality, atraumatic Neck: no adenopathy, supple, symmetrical, trachea midline and thyroid normal to inspection and palpation Lungs: clear to auscultation bilaterally Breasts: normal appearance, no masses or tenderness Heart: regular rate and rhythm Abdomen: soft, non-tender; no masses,  no organomegaly Extremities: extremities normal, atraumatic, no cyanosis or edema Skin: Skin color, texture, turgor normal. No rashes or  lesions Lymph nodes: Cervical, supraclavicular, and axillary nodes normal. No abnormal inguinal nodes palpated Neurologic: Grossly normal   Pelvic: External genitalia:  no lesions              Urethra:  normal appearing urethra with no masses, tenderness or lesions              Bartholin's and Skene's: normal                 Vagina: normal appearing vagina with normal color and discharge, no lesions              Cervix: anteverted              Pap taken: Yes.   Bimanual Exam:  Uterus:  normal size, contour, position, consistency, mobility, non-tender              Adnexa: no mass, fullness, tenderness               Rectovaginal: Confirms               Anus:  normal sphincter tone, no lesions  Chaperone present:  yes  A:  Well Woman with normal exam  Postmenopausal - no HRT S/P left hip replacement 06/19/13  Recent situational stressors with panic attack  R/O UTI   P:   Reviewed health and wellness pertinent to exam  Pap smear as above  Mammogram is due and will schedule  IFOB is given  Rx. for Xanax 0.5 mg 1/2 - 1 tablet prn for panic # 30 /no further refills as this will be done when establishes care with new PCP.  Names of PCP given along with ortho to help her with left foot pain.  Counseled on breast self exam, mammography screening, adequate intake of calcium and vitamin D, diet and exercise, Kegel's exercises  return annually or prn  An After Visit Summary was printed and given to the patient.

## 2014-09-03 NOTE — Patient Instructions (Addendum)

## 2014-09-05 LAB — URINE CULTURE
COLONY COUNT: NO GROWTH
Organism ID, Bacteria: NO GROWTH

## 2014-09-05 NOTE — Progress Notes (Signed)
Encounter reviewed by Dr. Tequila Rottmann Amundson C. Silva.  

## 2014-09-08 LAB — IPS PAP TEST WITH HPV

## 2014-09-29 LAB — FECAL OCCULT BLOOD, IMMUNOCHEMICAL: IFOBT: NEGATIVE

## 2014-09-29 NOTE — Addendum Note (Signed)
Addended by: Graylon Good on: 09/29/2014 08:58 AM   Modules accepted: Orders

## 2014-10-04 ENCOUNTER — Other Ambulatory Visit: Payer: Self-pay | Admitting: Nurse Practitioner

## 2014-10-04 DIAGNOSIS — E038 Other specified hypothyroidism: Secondary | ICD-10-CM

## 2014-10-04 NOTE — Telephone Encounter (Signed)
Patient wanting a refill on the levothyroxine [facility] [phone].

## 2014-10-04 NOTE — Telephone Encounter (Signed)
Medication refill request: synthroid  Last AEX:  09/03/14 PG Next AEX: 09/07/15 PG Last MMG (if hormonal medication request): 12/05/12 [email or web address]:neg Refill authorized: given by PCP? please advise.

## 2014-10-04 NOTE — Telephone Encounter (Signed)
Patient is asking for status of refill request.  I told patient request was still pending PG approval. Patient is asking for a call back if approved or not approved.

## 2014-10-04 NOTE — Telephone Encounter (Signed)
I must have been mistaken at our conversation at AEX - I thought PCP was managing her Thyroid now and did not do TSH or refill med's.  If we are still going to manage then she needs a TSH drawn.  She can have a month refill until the TSH is done.  Order is placed for TSH.  Please call her and schedule.

## 2014-10-05 NOTE — Telephone Encounter (Signed)
I called patient and set up her lab appointment for next week. She said that her PCP just recently moved and she is trying to find one and would like for you to manage her thyroid.

## 2014-10-07 MED ORDER — LEVOTHYROXINE SODIUM 75 MCG PO TABS: 75.0000 ug | ORAL_TABLET | Freq: Every day | ORAL | Status: DC

## 2014-10-07 NOTE — Telephone Encounter (Addendum)
I spoke with patient and let her know that the RX was called in- eh

## 2014-10-12 ENCOUNTER — Other Ambulatory Visit (INDEPENDENT_AMBULATORY_CARE_PROVIDER_SITE_OTHER): Payer: BLUE CROSS/BLUE SHIELD

## 2014-10-12 DIAGNOSIS — E038 Other specified hypothyroidism: Secondary | ICD-10-CM

## 2014-10-12 LAB — TSH: TSH: 5.263 u[IU]/mL — ABNORMAL HIGH (ref 0.350–4.500)

## 2014-10-13 ENCOUNTER — Encounter: Payer: Self-pay | Admitting: Nurse Practitioner

## 2014-10-13 ENCOUNTER — Other Ambulatory Visit: Payer: Self-pay | Admitting: Nurse Practitioner

## 2014-10-13 DIAGNOSIS — E039 Hypothyroidism, unspecified: Secondary | ICD-10-CM

## 2014-10-13 MED ORDER — LEVOTHYROXINE SODIUM 100 MCG PO TABS: 100.0000 ug | ORAL_TABLET | Freq: Every day | ORAL | Status: DC

## 2014-10-13 NOTE — Progress Notes (Signed)
Entered by Kem Boroughs, FNP at 10/13/2014 7:48 AM    Belenda Cruise, The thyroid test is elevated from 2 years ago @ 2.141 to 5.263 now. The dose you are on for hypothyroid is not sufficient. You will now need to go up from 75 mcg to 100 mcg. You will need to have a recheck TSH in 6 weeks. I suspect you may need a higher dose still. The thyroid being abnormal may be also causing an increase on your symptoms of anxiety, fatigue, etc. The RX will be sent to your pharmacy. I will have a phone nurse to also call you and verify that you got the message but also to answer any questions that you may have

## 2014-10-14 ENCOUNTER — Telehealth: Payer: Self-pay

## 2014-10-14 NOTE — Telephone Encounter (Signed)
Left message to call Kaitlyn at [phone]. 

## 2014-10-14 NOTE — Telephone Encounter (Signed)
Visit Follow-Up Question  Message [id number]   From  KARYSSA AMARAL   To  Kem Boroughs, FNP   Sent  10/13/2014 5:58 PM     Patty - Would the abnormal thyroid test be due in part because I had not been taking the Rx for at least 2 weeks? I had run out of the Rx & it took me a while to get it refilled since Dr. Minna Antis moved to [place].   Also, I haven't contacted [facility] yet. Please remind me which doctor you thought I should see ( & was still accepting new patients).   Thanks -      Responsible Party    Pool - [facility] Clinical Pool No one has taken responsibility for this message.     No actions have been taken on this message.     Patient is on Synthroid 100 mcg daily. Routing mychart message to Melvia Heaps CNM for review and advise.

## 2014-10-14 NOTE — Telephone Encounter (Signed)
If she had be off her thyroid medication this could change her TSH, should stabilize. Dr Ardeth Perfect is taking new patients with [facility].

## 2014-10-14 NOTE — Telephone Encounter (Signed)
Spoke with patient. Advised of message as seen below from [place]. Patient states that her Synthroid was increased to 100 mcg daily based on recent lab result. Was previously on 75 mcg. Patient is asking if she needs to take 100 mcg daily or 75 mcg since she was off for two weeks prior to lab appointment. Advised I will clarify with Melvia Heaps CNM and return call. Patient is agreeable.

## 2014-10-14 NOTE — Telephone Encounter (Signed)
Telephone encounter created to discuss mychart message with Melvia Heaps CNM.

## 2014-10-14 NOTE — Telephone Encounter (Signed)
Take the 100 mcg

## 2014-10-15 NOTE — Telephone Encounter (Signed)
Spoke with patient. Advised of message as seen below from [place]. Patient is agreeable. 6 week lab recheck scheduled for 11/7 at 4:10 pm. Agreeable to date and time.  Routing to provider for final review. Patient agreeable to disposition. Will close encounter.

## 2014-10-18 NOTE — Telephone Encounter (Signed)
-----   Message from Kem Boroughs, [place] sent at 10/13/2014  7:48 AM EDT ----- Results via my chart:  Need a follow phone call for verification of receipt of my chart message and change of dose of Synthroid.  Rx and apt is in Epic for a 6 week recheck.  Audrey Chandler,  The thyroid test is elevated from 2 years ago @ 2.141 to 5.263 now.  The dose you are on for hypothyroid is not sufficient.  You will now need to go up from 75 mcg to 100 mcg.  You will need to have a recheck TSH in 6 weeks.  I suspect you may need a higher dose still.  The thyroid being abnormal may be also causing an increase on your symptoms of anxiety, fatigue, etc.  The RX will be sent to your pharmacy.  I will have a phone nurse to also call you and verify that you got the message but also to answer any questions that you may have.

## 2014-11-22 ENCOUNTER — Other Ambulatory Visit (INDEPENDENT_AMBULATORY_CARE_PROVIDER_SITE_OTHER): Payer: BLUE CROSS/BLUE SHIELD

## 2014-11-22 DIAGNOSIS — E039 Hypothyroidism, unspecified: Secondary | ICD-10-CM

## 2014-11-23 ENCOUNTER — Other Ambulatory Visit: Payer: Self-pay | Admitting: Nurse Practitioner

## 2014-11-23 DIAGNOSIS — E039 Hypothyroidism, unspecified: Secondary | ICD-10-CM

## 2014-11-23 LAB — TSH: TSH: 2.957 u[IU]/mL (ref 0.350–4.500)

## 2014-12-07 ENCOUNTER — Telehealth: Payer: Self-pay | Admitting: Nurse Practitioner

## 2014-12-08 MED ORDER — LEVOTHYROXINE SODIUM 100 MCG PO TABS
100.0000 ug | ORAL_TABLET | Freq: Every day | ORAL | Status: DC
Start: 2014-12-08 — End: 2015-03-14

## 2014-12-08 NOTE — Telephone Encounter (Signed)
Call to patient. [facility]. She states she has been feeling improved and has less fatigue. Would like to continue as instructed.  Lab appointment scheduled for follow up TSH 01/25/15 at 1115.  Routing to provider for final review. Patient agreeable to disposition. Will close encounter.

## 2014-12-08 NOTE — Addendum Note (Signed)
Addended by: Antonietta Barcelona on: 12/08/2014 09:43 AM   Modules accepted: Orders, Medications

## 2015-01-25 ENCOUNTER — Other Ambulatory Visit (INDEPENDENT_AMBULATORY_CARE_PROVIDER_SITE_OTHER): Payer: BLUE CROSS/BLUE SHIELD

## 2015-01-25 DIAGNOSIS — E039 Hypothyroidism, unspecified: Secondary | ICD-10-CM

## 2015-01-26 LAB — TSH: TSH: 2.891 u[IU]/mL (ref 0.350–4.500)

## 2015-02-04 ENCOUNTER — Ambulatory Visit (INDEPENDENT_AMBULATORY_CARE_PROVIDER_SITE_OTHER): Payer: BLUE CROSS/BLUE SHIELD | Admitting: Nurse Practitioner

## 2015-02-04 VITALS — BP 132/74 | HR 68 | Resp 16 | Wt 266.0 lb

## 2015-02-04 DIAGNOSIS — E039 Hypothyroidism, unspecified: Secondary | ICD-10-CM

## 2015-02-04 NOTE — Progress Notes (Signed)
Patient is here for weight and BP check.  Patient notes she is doing well.  She denies any questions or concerns.  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  She denies any thinning of hair.    She denies any other questions or concerns.  She was advised to call if any concerns arise.  Encounter closed and routed to provider.

## 2015-02-06 IMAGING — CR DG PORTABLE PELVIS
1 series · 1 of 1 positions shown · non-contrast
Comparison: Intraoperative imaging earlier today.

CLINICAL DATA: Status post left hip arthroplasty.

EXAM:
PORTABLE PELVIS 1-2 VIEWS

[AP]
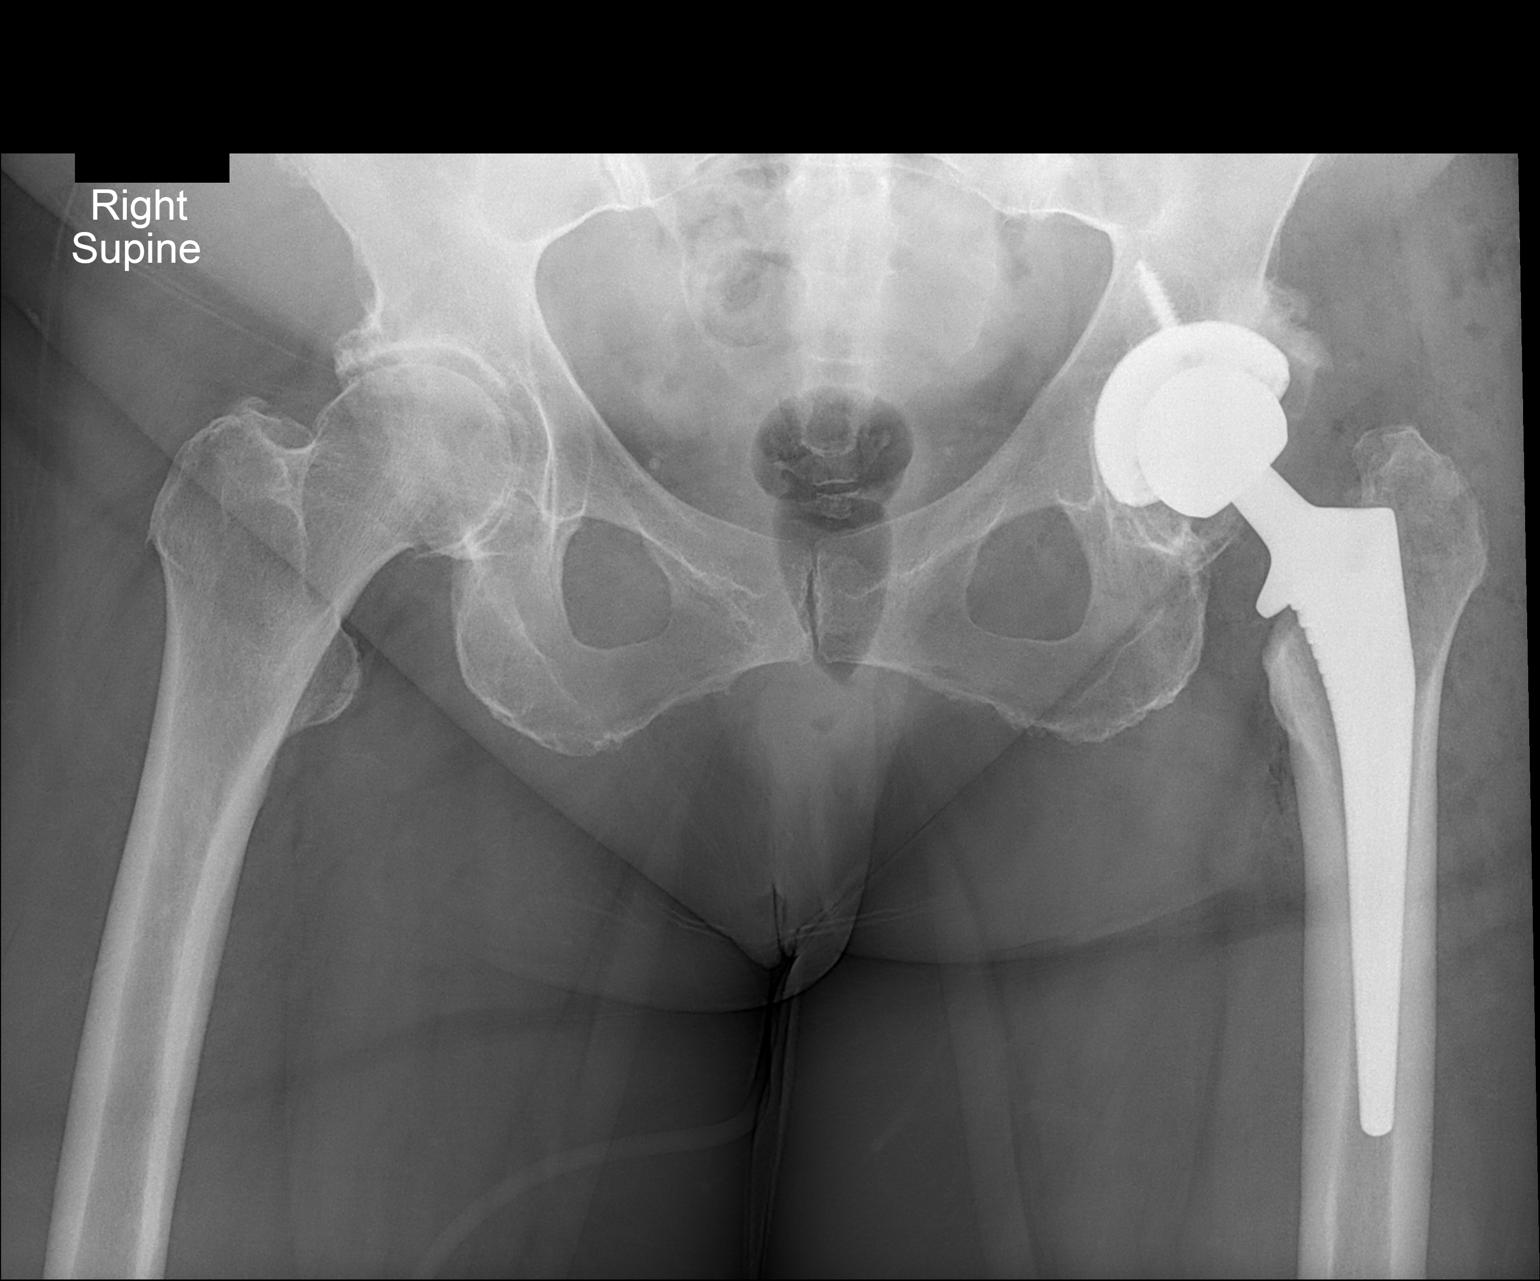

[1 of 1 positions shown; findings below may reference images not displayed]

FINDINGS: Frontal projection of the pelvis shows normal alignment of a total
left hip arthroplasty. No fracture or abnormal lucency is
identified.
IMPRESSION: Normal alignment status post left hip arthroplasty.

## 2015-02-06 IMAGING — RF DG HIP (WITH OR WITHOUT PELVIS) 2-3V*L*
1 series · 2 of 2 positions shown · non-contrast
Comparison: 12/29/2012

CLINICAL DATA: Left total hip replacement.

EXAM:
LEFT HIP - COMPLETE 2+ VIEW

[Series 1: run · 2 of 2 slices shown]
[im 1/2]
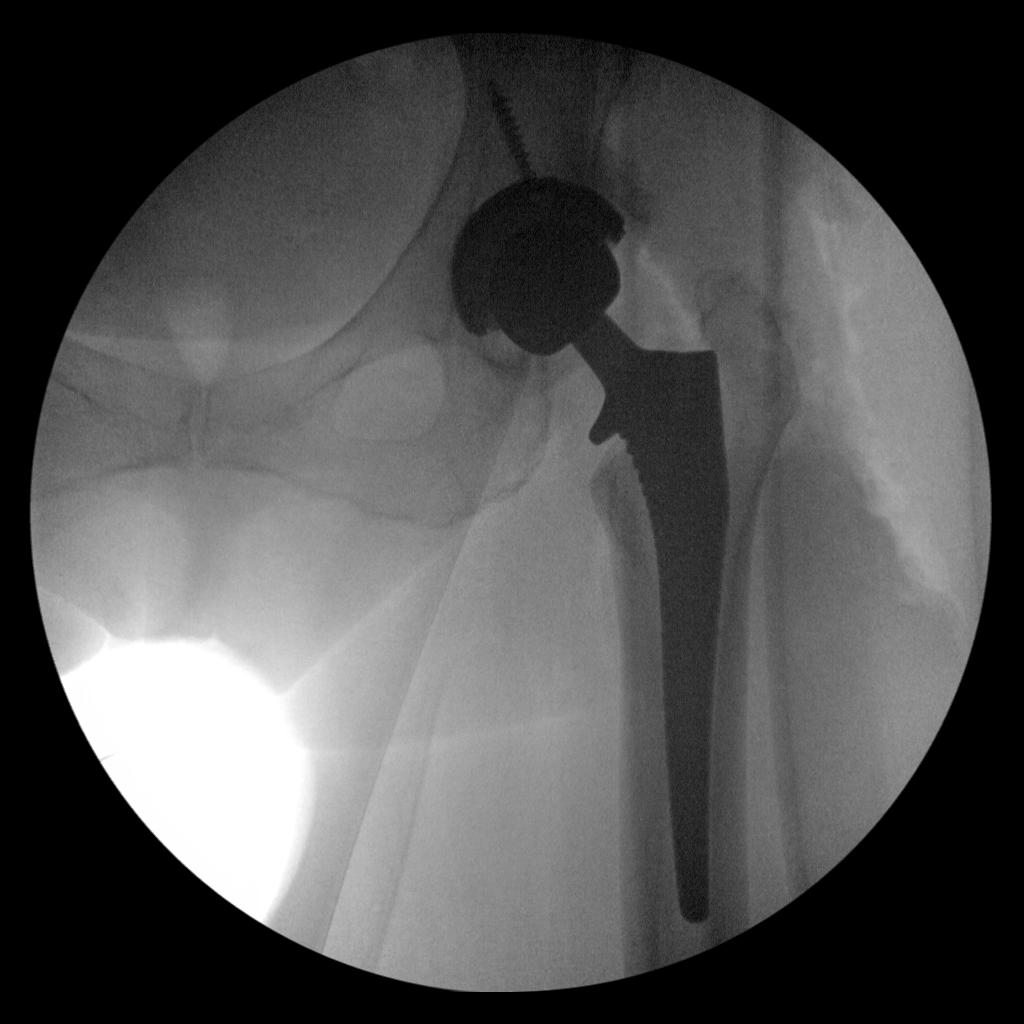
[im 2/2]
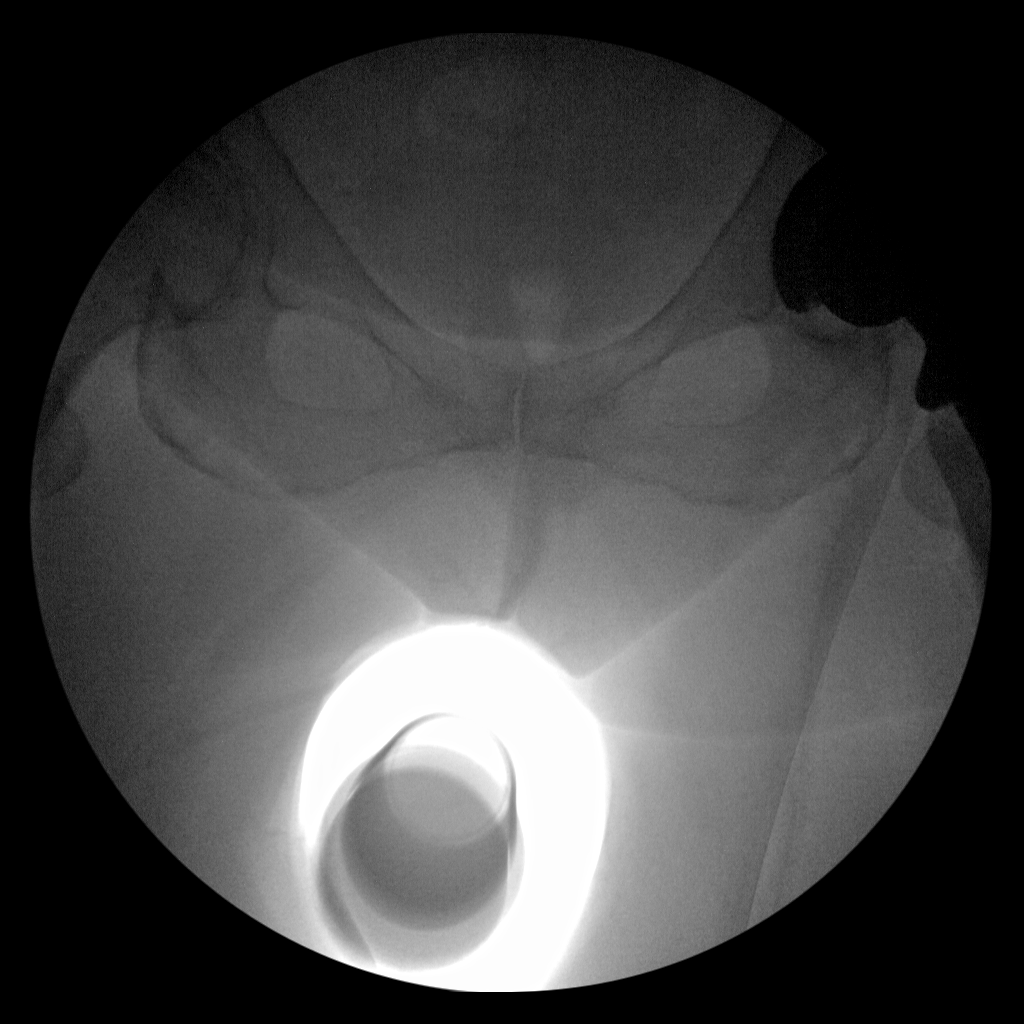

[2 of 2 positions shown; findings below may reference images not displayed]

FINDINGS: 2 intraoperative spot images demonstrate changes of left hip
replacement. Normal AP alignment. No hardware or bony complicating
feature.
IMPRESSION: Left hip replacement without complicating feature.

## 2015-02-07 ENCOUNTER — Encounter: Payer: Self-pay | Admitting: Nurse Practitioner

## 2015-02-07 NOTE — Progress Notes (Signed)
Nurse visit reviewed. No change in plan.

## 2015-03-14 ENCOUNTER — Other Ambulatory Visit: Payer: Self-pay | Admitting: Nurse Practitioner

## 2015-03-14 NOTE — Telephone Encounter (Signed)
Medication refill request: Levothyroxine Last AEX:  09-03-14 Next AEX: 09-07-15 Last MMG (if hormonal medication request): 11-08-14 WNL Refill authorized: please advise

## 2015-06-14 ENCOUNTER — Other Ambulatory Visit: Payer: Self-pay | Admitting: Nurse Practitioner

## 2015-06-14 NOTE — Telephone Encounter (Signed)
Medication refill request: Synthroid  Last AEX:  09/03/14 PG Next AEX: 09/07/15 PG Last MMG (if hormonal medication request): 11/08/14 [email or web address]:neg Refill authorized: 03/14/15 #[id number]/0R. Today please advise.

## 2015-07-28 ENCOUNTER — Other Ambulatory Visit: Payer: Self-pay | Admitting: Nurse Practitioner

## 2015-07-28 NOTE — Telephone Encounter (Signed)
Patient sent email request for Metrogel 1%  Medication refill request: Metrogel Last AEX:  09/03/14 PG Next AEX: 10/04/15 Last MMG (if hormonal medication request): 11/08/14 [email or web address] Refill authorized: 09/03/14 #[id number]  0R. Please advise. Thank you.

## 2015-07-29 MED ORDER — METRONIDAZOLE 1 % EX GEL: Freq: Every day | CUTANEOUS | Status: DC

## 2015-08-01 ENCOUNTER — Telehealth: Payer: Self-pay

## 2015-08-01 ENCOUNTER — Other Ambulatory Visit: Payer: Self-pay

## 2015-08-01 NOTE — Telephone Encounter (Signed)
Yes directions are the same.  Pt preference is OK to change.

## 2015-08-01 NOTE — Telephone Encounter (Signed)
Per fax received, patient prefers Metronidazole Lotion instead of gel 0.75%. Okay to switch? If so, are the directions the same? Please advise  Metrogel was prescribed 1%, apply to affected area every day #60grams for 30days w/1 refill

## 2015-08-01 NOTE — Telephone Encounter (Signed)
Called pharmacy with okay to switch to Metronidazole lotion instead of gel.

## 2015-08-01 NOTE — Telephone Encounter (Signed)
error 

## 2015-08-04 ENCOUNTER — Telehealth: Payer: Self-pay | Admitting: Nurse Practitioner

## 2015-08-04 MED ORDER — METRONIDAZOLE 1 % EX GEL: Freq: Every day | CUTANEOUS | Status: DC

## 2015-08-04 NOTE — Telephone Encounter (Signed)
Spoke with patient. Patient states that she has always received Metrogel rx in a pump. States the pharmacy requested a refill for her and the rx was sent in a tube. Advised this prescription is most commonly sent in a tube as this is how it is dispensed. Patient states the pharmacy is able to dispense this in a pump which is easier for her to use. Advised I will contact the pharmacy to review what needs to be done to make this change. She is agreeable.  Spoke with Jinny Blossom at [facility]. Who states that the patient's rx was switched from Metrogel 1 % gel to Metrogel 0.75 % lotion by our office on 08/01/2015. Per telephone note this was requested by the pharmacy as the patient's preference. Per Jinny Blossom the Metrogel pump only comes in a 1 % gel form. Advised I will verify with the patient her preference and send in new rx if needed.  Spoke with patient. Patient states that she was not aware the lotion did not come in a pump form and prefers to have the gel in the pump. Advised I will send in new rx for Metrogel 1% gel in a 55 gram pump. The patient is agreeable and verbalizes understanding.  Routing to provider for final review. Patient agreeable to disposition. Will close encounter.

## 2015-08-04 NOTE — Telephone Encounter (Signed)
Patient would like to speak with nurse about her metrogel medication that is wrong at the pharmacy. She is requesting the metrogel 1% gel in a pump not the tube. [facility] [address] at [phone]

## 2015-09-07 ENCOUNTER — Ambulatory Visit: Payer: BLUE CROSS/BLUE SHIELD | Admitting: Nurse Practitioner

## 2015-10-04 ENCOUNTER — Ambulatory Visit: Payer: BLUE CROSS/BLUE SHIELD | Admitting: Nurse Practitioner

## 2015-10-11 ENCOUNTER — Ambulatory Visit (INDEPENDENT_AMBULATORY_CARE_PROVIDER_SITE_OTHER): Payer: BLUE CROSS/BLUE SHIELD | Admitting: Nurse Practitioner

## 2015-10-11 ENCOUNTER — Encounter: Payer: Self-pay | Admitting: *Deleted

## 2015-10-11 ENCOUNTER — Encounter: Payer: Self-pay | Admitting: Nurse Practitioner

## 2015-10-11 VITALS — BP 124/76 | HR 64 | Ht 67.0 in | Wt 276.0 lb

## 2015-10-11 DIAGNOSIS — E039 Hypothyroidism, unspecified: Secondary | ICD-10-CM | POA: Diagnosis not present

## 2015-10-11 DIAGNOSIS — Z0000 Encounter for general adult medical examination without abnormal findings: Secondary | ICD-10-CM

## 2015-10-11 DIAGNOSIS — Z01419 Encounter for gynecological examination (general) (routine) without abnormal findings: Secondary | ICD-10-CM | POA: Diagnosis not present

## 2015-10-11 NOTE — Patient Instructions (Signed)

## 2015-10-11 NOTE — Progress Notes (Signed)
Patient ID: Audrey Chandler, female   DOB: [birth year], 60 y.o.   MRN: [id number]  59 y.o. [id number] Married  Caucasian Fe here for annual exam.  No new health problems.  Rare panic attacks and has only taken 5 tablets in a year. Husband now diagnosed with hypertrophic cardiomyopathy.   Patient's last menstrual period was 12/16/2007 (approximate).          Sexually active: Yes.    The current method of family planning is post menopausal status.    Exercising: No.  The patient does not participate in regular exercise at present. Smoker:  no  Health Maintenance: Pap: 09/03/14, Negative with negative HR HPV MMG: 11/08/14, Bi-Rads 1:  Negative Colonoscopy:  07/23/2008 scattered diverticula, recheck in 10 years BMD:   07/26/2010  T Score: spine 3.3/ right hip neck -0.7 / total 0.0 TDaP:  PCP maintains tetanus.   Shingles: never  Pneumonia: Not indicated due to age 57 C and [id number] today Labs: HB: PCP   Urine: PCP   reports that she has quit smoking. She has a 1.00 pack-year smoking history. She has never used smokeless tobacco. She reports that she drinks about 0.5 oz of alcohol per week . She reports that she does not use drugs.  Past Medical History:  Diagnosis Date  . Arthritis   . Closed fracture dislocation of multiple fingers 7th grade   left hand 4th & 5th fingers  . Fracture of wrist, closed 6th grade   right  . Hypothyroidism   . Seizure disorder ([place]) 2010  . Seizures ([place])   . Thyroid disease 06/2008   hypothyroidism  . Vitamin D deficiency     Past Surgical History:  Procedure Laterality Date  . KNEE ARTHROSCOPY Right 2010  . TOTAL HIP ARTHROPLASTY Left 06/19/2013   Procedure: LEFT TOTAL HIP ARTHROPLASTY ANTERIOR APPROACH;  Surgeon: Mcarthur Rossetti, MD;  Location: [facility];  Service: Orthopedics;  Laterality: Left;  Marland Kitchen VAGINAL DELIVERY  x3  . WISDOM TOOTH EXTRACTION  age 42    Current Outpatient Prescriptions  Medication Sig Dispense Refill  . ALPRAZolam (XANAX)  0.5 MG tablet Take 1 tablet (0.5 mg total) by mouth at bedtime as needed for anxiety. 30 tablet 0  . clobetasol (OLUX) 0.05 % topical foam Apply 1 application topically 2 (two) times daily as needed (rosacea).     . doxycycline (VIBRA-TABS) 100 MG tablet Take 100 mg by mouth daily.    Marland Kitchen lamoTRIgine (LAMICTAL) 100 MG tablet Take 100 mg by mouth 2 (two) times daily.    Marland Kitchen levothyroxine (SYNTHROID, LEVOTHROID) 100 MCG tablet TAKE 1 TABLET (100 MCG TOTAL) BY MOUTH DAILY. 90 tablet 2  . metroNIDAZOLE (METROGEL) 1 % gel Apply topically daily. 55 g 0  . nabumetone (RELAFEN) 750 MG tablet TAKE 1 TABLET TWICE A DAY AS NEEDED FOR INFLAMMATION/PAIN  1   No current facility-administered medications for this visit.     Family History  Problem Relation Age of Onset  . Pulmonary disease Mother   . Heart failure Mother   . Cancer Father     prostate cancer  . Hypertension Father   . Renal Disease Father   . Diabetes Paternal Grandfather   . Bipolar disorder Sister   . Renal Disease Maternal Aunt     ROS:  Pertinent items are noted in HPI.  Otherwise, a comprehensive ROS was negative.  Exam:   BP 124/76 (BP Location: Right Arm, Patient Position: Sitting, Cuff Size: Large)   Pulse  64   Ht 5\' 7"  (1.702 m)   Wt 276 lb (125.2 kg)   LMP 12/16/2007 (Approximate)   BMI 43.23 kg/m  Height: 5\' 7"  (170.2 cm) Ht Readings from Last 3 Encounters:  10/11/15 5\' 7"  (1.702 m)  09/03/14 5\' 7"  (1.702 m)  08/25/13 5\' 7"  (1.702 m)    General appearance: alert, cooperative and appears stated age Head: Normocephalic, without obvious abnormality, atraumatic Neck: no adenopathy, supple, symmetrical, trachea midline and thyroid normal to inspection and palpation Lungs: clear to auscultation bilaterally Breasts: normal appearance, no masses or tenderness Heart: regular rate and rhythm Abdomen: soft, non-tender; no masses,  no organomegaly Extremities: extremities normal, atraumatic, no cyanosis or edema Skin:  Skin color, texture, turgor normal. No rashes or lesions Lymph nodes: Cervical, supraclavicular, and axillary nodes normal. No abnormal inguinal nodes palpated Neurologic: Grossly normal   Pelvic: External genitalia:  no lesions              Urethra:  normal appearing urethra with no masses, tenderness or lesions              Bartholin's and Skene's: normal                 Vagina: normal appearing vagina with normal color and discharge, no lesions              Cervix: anteverted              Pap taken: No. Bimanual Exam:  Uterus:  normal size, contour, position, consistency, mobility, non-tender              Adnexa: no mass, fullness, tenderness               Rectovaginal: Confirms               Anus:  normal sphincter tone, no lesions  Chaperone present: yes  A:  Well Woman with normal exam    Postmenopausal - no HRT  S/P left hip replacement 06/19/13  Hypothyroid on replacement  Rosacea     P:   Reviewed health and wellness pertinent to exam  Pap smear as above  Mammogram Is due 10/17 and will get BMD  Counseled on breast self exam, mammography screening, adequate intake of calcium and vitamin D, diet and exercise, Kegel's exercises return annually or prn  An After Visit Summary was printed and given to the patient.

## 2015-10-12 ENCOUNTER — Encounter: Payer: Self-pay | Admitting: Nurse Practitioner

## 2015-10-12 LAB — HIV ANTIBODY (ROUTINE TESTING W REFLEX): HIV 1&2 Ab, 4th Generation: NONREACTIVE

## 2015-10-12 LAB — HEPATITIS C ANTIBODY: HCV Ab: NEGATIVE

## 2015-10-13 ENCOUNTER — Telehealth: Payer: Self-pay

## 2015-10-13 NOTE — Telephone Encounter (Signed)
Spoke with patient. Patient states that she received a message through mychart that was requesting weight and blood pressure to be taken. "I think this was from Kem Boroughs, FNP, but I am not sure." Per review of chart I do not see mychart encounter from Kem Boroughs, [place] regarding weight and BP. Advised this may have been automated notification from mychart. Advised I will speak with Kem Boroughs, FNP to ensure further evaluation is not needed and return call. patient is agreeable.  Visit Follow-Up Question  Message [id number]  From YASMYN WENZ To Kem Boroughs, FNP Sent 10/12/2015 9:30 AM  Patty -   Regarding request for another visit for BP & weight. Both my weight & blood pressure were taken yesterday. Is your request for different tests?   Thanks -   Responsible Party   Pool - [facility] Pool No one has taken responsibility for this message.  No actions have been taken on this message.

## 2015-10-13 NOTE — Telephone Encounter (Signed)
Spoke with patient. Please see telephone encounter dated 10/13/2015.

## 2015-10-13 NOTE — Progress Notes (Signed)
Encounter reviewed by Dr. Brook Amundson C. Silva.  

## 2015-10-14 NOTE — Telephone Encounter (Signed)
Her vital signs were completed on day of exam 10/11/15  Ht: 5'7", BP 124/76; pulse 64; Wt: 276 lbs.  Not sure who is asking for this information or why.

## 2015-10-17 NOTE — Telephone Encounter (Signed)
Left detailed message at number provided, [phone], okay per ROI. Advised of message as seen below from Kem Boroughs, [place]. Advised nothing further is needed at this time and to return call with any further questions.  Routing to provider for final review. Patient agreeable to disposition. Will close encounter.

## 2015-11-16 ENCOUNTER — Telehealth: Payer: Self-pay | Admitting: Nurse Practitioner

## 2015-11-16 NOTE — Telephone Encounter (Signed)
Please let pt know that BMD done on 11/10/15 shows a T score at the spine of +2.50, right hip neck at -1.50 and left radius at -1.80.  Comparison to previous study 07/26/10 can not be made because of different machines.  The lowest reading is at the radius but also low normal at the right hip.  Needs to continue with walking and upper body weights.  Also continue with Vit D and calcium support.  Repeat again in 2 yrs for a better comparison study.

## 2015-11-18 NOTE — Telephone Encounter (Signed)
Patient notified of bone density results.  Values given.  Advised there is no comparison to previous study and patient voices understanding to why.  Agreeable with recommendations and will repeat in 2 years.

## 2015-11-29 ENCOUNTER — Encounter: Payer: Self-pay | Admitting: Nurse Practitioner

## 2015-12-04 ENCOUNTER — Other Ambulatory Visit (INDEPENDENT_AMBULATORY_CARE_PROVIDER_SITE_OTHER): Payer: Self-pay | Admitting: Physician Assistant

## 2015-12-05 NOTE — Telephone Encounter (Signed)
Please advise 

## 2016-01-02 ENCOUNTER — Other Ambulatory Visit (INDEPENDENT_AMBULATORY_CARE_PROVIDER_SITE_OTHER): Payer: Self-pay | Admitting: Orthopaedic Surgery

## 2016-01-02 NOTE — Telephone Encounter (Signed)
Please advise 

## 2016-02-14 ENCOUNTER — Telehealth (INDEPENDENT_AMBULATORY_CARE_PROVIDER_SITE_OTHER): Payer: Self-pay | Admitting: Orthopaedic Surgery

## 2016-02-14 NOTE — Telephone Encounter (Signed)
RECEIVED CALL FROM PATIENT STATING [id number] FAXED OVER A RX REFILL FOR MABUMETONE. PATIENT SAID SHE CONTACTED [facility] ON JAN 11TH IN REFERENCE TO THE Rx. NUMBER TO CONTACT PATIENT [phone]

## 2016-02-14 NOTE — Telephone Encounter (Signed)
What is [facility]?

## 2016-02-15 ENCOUNTER — Other Ambulatory Visit (INDEPENDENT_AMBULATORY_CARE_PROVIDER_SITE_OTHER): Payer: Self-pay | Admitting: Orthopaedic Surgery

## 2016-02-16 ENCOUNTER — Other Ambulatory Visit: Payer: Self-pay | Admitting: Nurse Practitioner

## 2016-02-16 NOTE — Telephone Encounter (Signed)
Medication refill request: doxycycline  Last AEX:  10/11/15 PG Next AEX: 10/31/16 PG Last MMG (if hormonal medication request): 11/10/15 BIRADS1:Neg  Refill authorized: please advise.

## 2016-02-16 NOTE — Telephone Encounter (Signed)
Patient request a refill of doxycycline (VIBRA-TABS) 100 MG tablet   [facility]  fx [phone] Ph [phone]

## 2016-02-16 NOTE — Telephone Encounter (Signed)
[facility] IS THE INSURANCE COMPANY.

## 2016-02-17 NOTE — Telephone Encounter (Signed)
Called patient. PCP gives this Rx for patient. She will call them and have them send Rx to pharmacy.

## 2016-02-17 NOTE — Telephone Encounter (Signed)
Do we given her this med or PCP - it is for her Rosacea.

## 2016-04-13 ENCOUNTER — Other Ambulatory Visit: Payer: Self-pay | Admitting: Nurse Practitioner

## 2016-04-16 NOTE — Telephone Encounter (Signed)
Medication refill request: Metronidazole Last AEX:  10/11/15 PG Next AEX: 10/31/16 PG Last MMG (if hormonal medication request): 11/10/15 BIRADS1, Density B, [facility] Refill authorized: 08/04/15 #55g 0R. Please advise. Thank you.   Routing to BS since PG is out of office.

## 2016-06-09 ENCOUNTER — Other Ambulatory Visit: Payer: Self-pay | Admitting: Nurse Practitioner

## 2016-06-12 NOTE — Telephone Encounter (Signed)
Medication refill request: Levothyroxine Last AEX:  10/11/15 PG Next AEX: 10/31/16 PG Last MMG (if hormonal medication request): 11/10/15 BIRADS1, Density B, [facility] Refill authorized: 06/14/15 #90 2R. Please advise. Thank you.

## 2016-10-31 ENCOUNTER — Ambulatory Visit: Payer: BLUE CROSS/BLUE SHIELD | Admitting: Nurse Practitioner

## 2016-10-31 ENCOUNTER — Encounter: Payer: Self-pay | Admitting: Certified Nurse Midwife

## 2016-10-31 ENCOUNTER — Ambulatory Visit (INDEPENDENT_AMBULATORY_CARE_PROVIDER_SITE_OTHER): Payer: No Typology Code available for payment source | Admitting: Certified Nurse Midwife

## 2016-10-31 VITALS — BP 124/78 | HR 74 | Resp 16 | Wt 271.0 lb

## 2016-10-31 DIAGNOSIS — F419 Anxiety disorder, unspecified: Secondary | ICD-10-CM | POA: Diagnosis not present

## 2016-10-31 DIAGNOSIS — M810 Age-related osteoporosis without current pathological fracture: Secondary | ICD-10-CM | POA: Diagnosis not present

## 2016-10-31 DIAGNOSIS — Z659 Problem related to unspecified psychosocial circumstances: Secondary | ICD-10-CM | POA: Diagnosis not present

## 2016-10-31 DIAGNOSIS — Z01419 Encounter for gynecological examination (general) (routine) without abnormal findings: Secondary | ICD-10-CM

## 2016-10-31 DIAGNOSIS — M8580 Other specified disorders of bone density and structure, unspecified site: Secondary | ICD-10-CM

## 2016-10-31 MED ORDER — ALPRAZOLAM 0.5 MG PO TABS: ORAL_TABLET | ORAL | 0 refills | Status: DC

## 2016-10-31 NOTE — Progress Notes (Signed)
61 y.o. [id number] Married  Caucasian Fe here for annual exam. Menopausal no HRT. Denies vaginal bleeding or vaginal dryness. Recent death of spouse in 02/08/2022, in his sleep beside her. Emotionally challenged now, children supportive. "Sad Patty has left".  Sees Dr. Ardeth Perfect as PCP for labs,Hypothyroid, anxiety issues. 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 Seeing Hospice grief therapist weekly for counseling which is helping her work through all the changes.Has worked non stop to finish clients taxes so she could out [place] to visit son, she has not seen a long time. Requests Xanax refill to travel. She has never flown with out spouse and this will be a challenge. Uses only with extreme concerns for anxiety. No other health issues today. Planning to stay real connected to family now.  Patient's last menstrual period was 12/16/2007 (approximate).          Sexually active: No.  The current method of family planning is abstinence.    Exercising: No.  The patient does not participate in regular exercise at present. Smoker:  Former smoker  Health Maintenance: Pap:  09-03-14 WNL NEG HR HPV History of Abnormal Pap: no MMG:  11-10-15 WNL Self Breast exams: no Colonoscopy:  2010 - repeat in 18yrs BMD:   11-11-15 Osteopenia  TDaP:  2000 Shingles: no Pneumonia: no Hep C and HIV: 10/11/15 NEG Labs: if needed   reports that she has quit smoking. She has a 1.00 pack-year smoking history. She has never used smokeless tobacco. She reports that she drinks about 0.5 oz of alcohol per week . She reports that she does not use drugs.  Past Medical History:  Diagnosis Date  . Arthritis   . Closed fracture dislocation of multiple fingers 7th grade   left hand 4th & 5th fingers  . Fracture of wrist, closed 6th grade   right  . Hypothyroidism   . Seizure disorder ([place]) 2010  . Seizures ([place])   . Thyroid disease 06/2008   hypothyroidism  .  Vitamin D deficiency     Past Surgical History:  Procedure Laterality Date  . KNEE ARTHROSCOPY Right 2010  . TOTAL HIP ARTHROPLASTY Left 06/19/2013   Procedure: LEFT TOTAL HIP ARTHROPLASTY ANTERIOR APPROACH;  Surgeon: Mcarthur Rossetti, MD;  Location: [facility];  Service: Orthopedics;  Laterality: Left;  Marland Kitchen VAGINAL DELIVERY  x3  . WISDOM TOOTH EXTRACTION  age 36    Current Outpatient Prescriptions  Medication Sig Dispense Refill  . ALPRAZolam (XANAX) 0.5 MG tablet Take 1 tablet (0.5 mg total) by mouth at bedtime as needed for anxiety. 30 tablet 0  . clobetasol (OLUX) 0.05 % topical foam Apply 1 application topically 2 (two) times daily as needed (rosacea).     . doxycycline (VIBRA-TABS) 100 MG tablet Take 100 mg by mouth daily.    Marland Kitchen lamoTRIgine (LAMICTAL) 100 MG tablet Take 100 mg by mouth 2 (two) times daily.    Marland Kitchen levothyroxine (SYNTHROID, LEVOTHROID) 100 MCG tablet TAKE 1 TABLET (100 MCG TOTAL) BY MOUTH DAILY. 90 tablet 2  . metroNIDAZOLE (METROGEL) 1 % gel APPLY TOPICALLY DAILY. 55 g 0  . nabumetone (RELAFEN) 750 MG tablet TAKE 1 TABLET TWICE A DAY AS NEEDED 60 tablet 0   No current facility-administered medications for this visit.     Family History  Problem Relation Age of Onset  . Pulmonary disease Mother   . Heart failure Mother   . Cancer Father  prostate cancer  . Hypertension Father   . Renal Disease Father   . Diabetes Paternal Grandfather   . Bipolar disorder Sister   . Renal Disease Maternal Aunt     ROS:  Pertinent items are noted in HPI.  Otherwise, a comprehensive ROS was negative.  Exam:   BP (!) 148/80 (BP Location: Right Arm, Patient Position: Sitting, Cuff Size: Large)   Pulse 84   Resp 16   Wt 271 lb (122.9 kg)   LMP 12/16/2007 (Approximate)   BMI 42.44 kg/m    Ht Readings from Last 3 Encounters:  10/11/15 5\' 7"  (1.702 m)  09/03/14 5\' 7"  (1.702 m)  08/25/13 5\' 7"  (1.702 m)    General appearance: alert, cooperative and appears stated  age Head: Normocephalic, without obvious abnormality, atraumatic Neck: no adenopathy, supple, symmetrical, trachea midline and thyroid normal to inspection and palpation Lungs: clear to auscultation bilaterally Breasts: normal appearance, no masses or tenderness, No nipple retraction or dimpling, No nipple discharge or bleeding, No axillary or supraclavicular adenopathy Heart: regular rate and rhythm Abdomen: soft, non-tender; no masses,  no organomegaly Extremities: extremities normal, atraumatic, no cyanosis or edema Skin: Skin color, texture, turgor normal. No rashes or lesions Lymph nodes: Cervical, supraclavicular, and axillary nodes normal. No abnormal inguinal nodes palpated Neurologic: Grossly normal   Pelvic: External genitalia:  no lesions              Urethra:  normal appearing urethra with no masses, tenderness or lesions              Bartholin's and Skene's: normal                 Vagina: normal appearing vagina with normal color and discharge, no lesions              Cervix: multiparous appearance, no cervical motion tenderness and no lesions              Pap taken: Yes.   Bimanual Exam:  Uterus:  normal size, contour, position, consistency, mobility, non-tender              Adnexa: normal adnexa and no mass, fullness, tenderness               Rectovaginal: Confirms               Anus:  normal sphincter tone, no lesions  Chaperone present: yes  A:  Well Woman with normal exam  Menopausal no HRT  Osteopenia working on calcium and vitamin D  Anxiety with panic attack with flying alone, Xanax works well  Social stress with spouse death  Hypothyroid with MD management  Immunization update  P:   Reviewed health and wellness pertinent to exam  Aware of need to evaluate if vaginal bleeding  Discussed importance of exercise weight bearing for bone health and will help with stress level also. Patient will try.  Rx Xanax # 6 for prn use for panic attacks no refills  Rx given  to patient with instructions  Continue Hospice counseling appointments  Continue follow up with PCP as indicated.  Declines TDAP today, will do at PCP  Pap smear: no   counseled on breast self exam, mammography screening, osteoporosis, adequate intake of calcium and vitamin D, diet and exercise  return annually or prn  An After Visit Summary was printed and given to the patient.

## 2016-10-31 NOTE — Patient Instructions (Signed)

## 2016-12-12 ENCOUNTER — Ambulatory Visit (INDEPENDENT_AMBULATORY_CARE_PROVIDER_SITE_OTHER): Payer: No Typology Code available for payment source | Admitting: Orthopaedic Surgery

## 2016-12-12 ENCOUNTER — Encounter (INDEPENDENT_AMBULATORY_CARE_PROVIDER_SITE_OTHER): Payer: Self-pay | Admitting: Orthopaedic Surgery

## 2016-12-12 ENCOUNTER — Ambulatory Visit (INDEPENDENT_AMBULATORY_CARE_PROVIDER_SITE_OTHER): Payer: No Typology Code available for payment source

## 2016-12-12 DIAGNOSIS — M25561 Pain in right knee: Secondary | ICD-10-CM

## 2016-12-12 DIAGNOSIS — G8929 Other chronic pain: Secondary | ICD-10-CM

## 2016-12-12 DIAGNOSIS — M1711 Unilateral primary osteoarthritis, right knee: Secondary | ICD-10-CM

## 2016-12-12 DIAGNOSIS — Z96642 Presence of left artificial hip joint: Secondary | ICD-10-CM | POA: Insufficient documentation

## 2016-12-12 DIAGNOSIS — M25552 Pain in left hip: Secondary | ICD-10-CM | POA: Diagnosis not present

## 2016-12-12 MED ORDER — LIDOCAINE HCL 1 % IJ SOLN
3.0000 mL | INTRAMUSCULAR | Status: AC | PRN
  Administered 2016-12-12: 3 mL

## 2016-12-12 MED ORDER — METHYLPREDNISOLONE ACETATE 40 MG/ML IJ SUSP
40.0000 mg | INTRAMUSCULAR | Status: AC | PRN
  Administered 2016-12-12: 40 mg via INTRA_ARTICULAR

## 2016-12-12 NOTE — Progress Notes (Signed)
Office Visit Note   Patient: Audrey Chandler           Date of Birth: 1955/07/28           MRN: [id number] Visit Date: 12/12/2016              Requested by: Velna Hatchet, MD [id number] [place], [place] [address] PCP: Velna Hatchet, MD   Assessment & Plan: Visit Diagnoses:  1. History of left hip replacement   2. Chronic pain of right knee   3. Unilateral primary osteoarthritis, right knee     Plan: I do feel that she is a candidate for steroid injection in her right knee.  She is not a diabetic.  Also talked about quad strengthening exercises and showed her several exercises to try.  She is also candidate for hyaluronic acid I gave her handout for this as well.  Certainly weight loss and low impact aerobic activities will help her the most.  We went over her x-rays in detail and talked about treatment options.  All questions and concerns were answered and addressed.  We will see her back in 4 weeks since she tolerated steroid injection well and place a hyaluronic acid injection in  Follow-Up Instructions: Return in about 4 weeks (around 01/09/2017).   Orders:  Orders Placed This Encounter  Procedures  . Large Joint Inj  . XR HIP UNILAT W OR W/O PELVIS 1V LEFT  . XR Knee 1-2 Views Right   No orders of the defined types were placed in this encounter.     Procedures: Large Joint Inj: R knee on 12/12/2016 11:14 AM Indications: diagnostic evaluation and pain Details: 22 G 1.5 in needle, superolateral approach  Arthrogram: No  Medications: 3 mL lidocaine 1 %; 40 mg methylPREDNISolone acetate 40 MG/ML Outcome: tolerated well, no immediate complications Procedure, treatment alternatives, risks and benefits explained, specific risks discussed. Consent was given by the patient. Immediately prior to procedure a time out was called to verify the correct patient, procedure, equipment, support staff and site/side marked as required. Patient was prepped and draped in the usual  sterile fashion.       Clinical Data: No additional findings.   Subjective: No chief complaint on file. The patient is listed as new patient but she someone actually performed a total hip arthroplasty on her left hip 3-1/2 years ago.  She did not left hip is doing great and she has no right hip pain.  She is coming today with chief complaint of right knee pain.  She is getting a lot of clicking and popping in that knee and it feels like is going to give out on times.  She says sometimes when she is been sitting up for long period time and goes to stand up she has to wait a few minutes before walking.  She says she does get swelling and pain in the back of her knee she denies any injury to this knee.  HPI  Review of Systems She currently denies any headache, chest pain, shortness of breath, fever, chills, nausea, vomiting.  Objective: Vital Signs: LMP 12/16/2007 (Approximate)   Physical Exam She is alert and oriented x3 and in no acute distress examination of both hips shows full range of motion of both hips.  Examination of her Ortho Exam Right knee shows a large soft tissue envelope around the knee.  She does have medial joint line tenderness and slight varus malalignment is easily correctable.  The knee feels  ligamentously stable.  It is difficult to examine fully due to her obesity.  There is patellofemoral crepitation her patella tracked slightly laterally. Specialty Comments:  No specialty comments available.  Imaging: Xr Hip Unilat W Or W/o Pelvis 1v Left  Result Date: 12/12/2016 An AP pelvis and lateral of the left hip shows a well-seated total hip arthroplasty on the left side with no complicating features.  The right hip appears normal.  Xr Knee 1-2 Views Right  Result Date: 12/12/2016 2 views of the right knee shows moderate arthritic changes mainly at the patellofemoral joint with some of the medial compartment as well.  There is no effusion.  There is no evidence of  fracture.    PMFS History: Patient Active Problem List   Diagnosis Date Noted  . Unilateral primary osteoarthritis, right knee 12/12/2016  . History of left hip replacement 12/12/2016  . Chronic pain of right knee 12/12/2016  . Arthritis of left hip 06/19/2013  . Status post THR (total hip replacement) 06/19/2013   Past Medical History:  Diagnosis Date  . Arthritis   . Closed fracture dislocation of multiple fingers 7th grade   left hand 4th & 5th fingers  . Fracture of wrist, closed 6th grade   right  . Hypothyroidism   . Seizure disorder ([place]) 2010  . Seizures ([place])   . Thyroid disease 06/2008   hypothyroidism  . Vitamin D deficiency     Family History  Problem Relation Age of Onset  . Pulmonary disease Mother   . Heart failure Mother   . Cancer Father        prostate cancer  . Hypertension Father   . Renal Disease Father   . Diabetes Paternal Grandfather   . Bipolar disorder Sister   . Renal Disease Maternal Aunt     Past Surgical History:  Procedure Laterality Date  . KNEE ARTHROSCOPY Right 2010  . TOTAL HIP ARTHROPLASTY Left 06/19/2013   Procedure: LEFT TOTAL HIP ARTHROPLASTY ANTERIOR APPROACH;  Surgeon: Mcarthur Rossetti, MD;  Location: [facility];  Service: Orthopedics;  Laterality: Left;  Marland Kitchen VAGINAL DELIVERY  x3  . WISDOM TOOTH EXTRACTION  age 61   Social History   Occupational History  . Occupation: Manufacturing engineer: [employer]  Tobacco Use  . Smoking status: Former Smoker    Packs/day: 0.50    Years: 2.00    Pack years: 1.00  . Smokeless tobacco: Never Used  Substance and Sexual Activity  . Alcohol use: Yes    Alcohol/week: 0.5 oz    Types: 1 Standard drinks or equivalent per week    Comment: occasional glass of wine   . Drug use: No  . Sexual activity: No    Partners: Male    Birth control/protection: Abstinence

## 2017-01-16 ENCOUNTER — Ambulatory Visit (INDEPENDENT_AMBULATORY_CARE_PROVIDER_SITE_OTHER): Payer: No Typology Code available for payment source | Admitting: Orthopaedic Surgery

## 2017-01-16 ENCOUNTER — Encounter (INDEPENDENT_AMBULATORY_CARE_PROVIDER_SITE_OTHER): Payer: Self-pay | Admitting: Orthopaedic Surgery

## 2017-01-16 DIAGNOSIS — M1711 Unilateral primary osteoarthritis, right knee: Secondary | ICD-10-CM

## 2017-01-16 MED ORDER — HYALURONAN 88 MG/4ML IX SOSY
88.0000 mg | PREFILLED_SYRINGE | INTRA_ARTICULAR | Status: AC | PRN
  Administered 2017-01-16: 88 mg via INTRA_ARTICULAR

## 2017-01-16 NOTE — Progress Notes (Signed)
   Procedure Note  Patient: Audrey Chandler             Date of Birth: 10/05/1955           MRN: [id number]             Visit Date: 01/16/2017  Procedures: Visit Diagnoses: Unilateral primary osteoarthritis, right knee  Large Joint Inj: R knee on 01/16/2017 10:39 AM Indications: pain and diagnostic evaluation Details: 22 G 1.5 in needle, superolateral approach  Arthrogram: No  Medications: 88 mg Hyaluronan 88 MG/4ML Outcome: tolerated well, no immediate complications Procedure, treatment alternatives, risks and benefits explained, specific risks discussed. Consent was given by the patient. Immediately prior to procedure a time out was called to verify the correct patient, procedure, equipment, support staff and site/side marked as required. Patient was prepped and draped in the usual sterile fashion.    The patient is following up after steroid injection in her right knee due to moderate arthritic pain as well as having a scheduled Monovisc injection the right knee.  She said the knee is actually doing very well and she is definitely interested in the hyaluronic acid because were looking for long-term pain relief for moderate arthritic knee.  On exam her knee is significantly obese.  It has good range of motion overall and feels stable.  We talked about the risk and benefits of hyaluronic acid and steroid injections and she tolerated the Monovisc injection well.  She knows to wait until least 3-4 months to have a steroid injection again only if needed.  She will follow-up as needed.

## 2017-01-29 ENCOUNTER — Telehealth (INDEPENDENT_AMBULATORY_CARE_PROVIDER_SITE_OTHER): Payer: Self-pay | Admitting: Radiology

## 2017-01-29 NOTE — Telephone Encounter (Signed)
Dr Paul Dykes office called to ask if patient needs premed for dental procedures.  I advised that this patient does not.  Note faxed to them as well, [phone]

## 2017-04-11 ENCOUNTER — Other Ambulatory Visit: Payer: Self-pay | Admitting: Certified Nurse Midwife

## 2017-04-11 NOTE — Telephone Encounter (Signed)
Patient tried to refill her levothyroxine and the pharmacy informed her our office denied the prescription. Patient was told to call the office.

## 2017-04-11 NOTE — Telephone Encounter (Signed)
Per review of Epic, no recent refill request.  Med refill request: levothyroxine 100 mcg tab Last filled 06/12/16 #90/2RF PG.  Last AEX: 10/31/16 DL Next AEX: 11/05/17 DL Last TSH 2.891 on  01/25/15.   Refill authorized: Melvia Heaps, CNM Please Advise?

## 2017-04-12 NOTE — Telephone Encounter (Signed)
Will need TSH checked first unless she has done at PCP if so will need results

## 2017-04-12 NOTE — Telephone Encounter (Signed)
Spoke with patient, advised as seen below per Melvia Heaps, CNM. Patient unsure if drawn at PCP, will check with PCP and return call.  Patient states she has 2-3 pills left, will return call with update. Is aware will need to schedule lab appt if no updated labs. Will review refill with Melvia Heaps, CNM once scheduled. Patient verbalizes understanding.

## 2017-04-12 NOTE — Telephone Encounter (Signed)
Returned patient's call and per DPR. Left detailed message stating if PCP did thyroid testing then they should be the ones to manage her therapy. Advised her to call them for refills.

## 2017-04-12 NOTE — Telephone Encounter (Signed)
Patient left message on machine at 1:01pm stating that she had TSH completed. Faxed document to Attn: Sharee Pimple. Would like for Sharee Pimple to return call.

## 2017-04-16 ENCOUNTER — Other Ambulatory Visit: Payer: Self-pay | Admitting: Certified Nurse Midwife

## 2017-04-16 DIAGNOSIS — E039 Hypothyroidism, unspecified: Secondary | ICD-10-CM

## 2017-04-16 MED ORDER — LEVOTHYROXINE SODIUM 100 MCG PO TABS: 100.0000 ug | ORAL_TABLET | Freq: Every day | ORAL | 3 refills | Status: DC

## 2017-04-16 NOTE — Progress Notes (Signed)
Rx for Hypothyroid sent

## 2017-04-16 NOTE — Telephone Encounter (Signed)
Pt aware we sent a rx to her pharmacy for her thyroid. Pt aware she needs to only have 1 office managing her thyroid. Pt agrees. Pt to call us & let us know to cancel her rx if she decides to let pcp check it.

## 2017-10-10 ENCOUNTER — Other Ambulatory Visit: Payer: Self-pay | Admitting: Obstetrics and Gynecology

## 2017-10-12 ENCOUNTER — Other Ambulatory Visit: Payer: Self-pay | Admitting: Obstetrics and Gynecology

## 2017-11-05 ENCOUNTER — Other Ambulatory Visit (HOSPITAL_COMMUNITY)
Admission: RE | Admit: 2017-11-05 | Discharge: 2017-11-05 | Disposition: A | Discharge status: Home / Self Care | Payer: No Typology Code available for payment source | Source: Ambulatory Visit | Attending: Certified Nurse Midwife | Admitting: Certified Nurse Midwife

## 2017-11-05 ENCOUNTER — Ambulatory Visit (INDEPENDENT_AMBULATORY_CARE_PROVIDER_SITE_OTHER): Payer: No Typology Code available for payment source | Admitting: Certified Nurse Midwife

## 2017-11-05 ENCOUNTER — Encounter: Payer: Self-pay | Admitting: Certified Nurse Midwife

## 2017-11-05 ENCOUNTER — Other Ambulatory Visit: Payer: Self-pay

## 2017-11-05 VITALS — BP 114/70 | HR 70 | Resp 16 | Ht 66.75 in | Wt 264.0 lb

## 2017-11-05 DIAGNOSIS — Z01419 Encounter for gynecological examination (general) (routine) without abnormal findings: Secondary | ICD-10-CM

## 2017-11-05 DIAGNOSIS — N951 Menopausal and female climacteric states: Secondary | ICD-10-CM

## 2017-11-05 DIAGNOSIS — Z124 Encounter for screening for malignant neoplasm of cervix: Secondary | ICD-10-CM

## 2017-11-05 DIAGNOSIS — E038 Other specified hypothyroidism: Secondary | ICD-10-CM | POA: Diagnosis not present

## 2017-11-05 NOTE — Patient Instructions (Signed)

## 2017-11-05 NOTE — Progress Notes (Signed)
62 y.o. [id number] Widowed  Caucasian Fe here for annual exam. Menopausal no HRT. Social stress with employment and loss of partner. Patient is making headway and good support. Sees Dr. Theda Sers for aex, labs and Lamictal, Clobetasol, for rosacea.. Hypothyroid medication working well, no changes. No other health issues today.     Patient's last menstrual period was 12/16/2007 (approximate).          Sexually active: No.  The current method of family planning is post menopausal status.    Exercising: Yes.    yoga Smoker:  no  Review of Systems  Constitutional: Negative.   HENT: Negative.   Eyes: Negative.   Respiratory: Negative.   Cardiovascular: Negative.   Gastrointestinal: Negative.   Genitourinary: Negative.   Musculoskeletal: Negative.   Skin: Negative.   Neurological: Negative.   Endo/Heme/Allergies: Negative.   Psychiatric/Behavioral: Negative.     Health Maintenance: Pap:  09-03-14 neg HPV HR neg History of Abnormal Pap: no MMG:  5/19 Rt breast neg, waiting on fax for bilateral Self Breast exams: no Colonoscopy:  2010 f/u 75yrs BMD:   2017 osteopenia PCP management TDaP:  2000 Shingles: no Pneumonia: no Hep C and HIV: both neg 2017 Labs: if needed   reports that she has quit smoking. She has a 1.00 pack-year smoking history. She has never used smokeless tobacco. She reports that she drinks about 1.0 standard drinks of alcohol per week. She reports that she does not use drugs.  Past Medical History:  Diagnosis Date  . Arthritis   . Closed fracture dislocation of multiple fingers 7th grade   left hand 4th & 5th fingers  . Fracture of wrist, closed 6th grade   right  . Hypothyroidism   . Seizure disorder ([place]) 2010  . Seizures ([place])   . Thyroid disease 06/2008   hypothyroidism  . Vitamin D deficiency     Past Surgical History:  Procedure Laterality Date  . KNEE ARTHROSCOPY Right 2010  . TOTAL HIP ARTHROPLASTY Left 06/19/2013   Procedure: LEFT TOTAL HIP  ARTHROPLASTY ANTERIOR APPROACH;  Surgeon: Mcarthur Rossetti, MD;  Location: [facility];  Service: Orthopedics;  Laterality: Left;  Marland Kitchen VAGINAL DELIVERY  x3  . WISDOM TOOTH EXTRACTION  age 52    Current Outpatient Medications  Medication Sig Dispense Refill  . clobetasol (TEMOVATE) 0.05 % external solution Apply 1 application topically 2 (two) times daily.    Marland Kitchen doxycycline (VIBRA-TABS) 100 MG tablet Take 100 mg by mouth daily.    Marland Kitchen HYDROcodone-acetaminophen (NORCO/VICODIN) 5-325 MG tablet TAKE 1 TABLET BY MOUTH EVERY NIGHT AS NEEDED  0  . lamoTRIgine (LAMICTAL) 100 MG tablet Take 100 mg by mouth 2 (two) times daily.    Marland Kitchen levothyroxine (SYNTHROID, LEVOTHROID) 100 MCG tablet Take 1 tablet (100 mcg total) by mouth daily before breakfast. 90 tablet 3  . metroNIDAZOLE (METROGEL) 1 % gel APPLY TOPICALLY DAILY. 55 g 0  . nabumetone (RELAFEN) 750 MG tablet TAKE 1 TABLET TWICE A DAY AS NEEDED 60 tablet 0  . UNABLE TO FIND cbd gummies 1 daily     No current facility-administered medications for this visit.     Family History  Problem Relation Age of Onset  . Pulmonary disease Mother   . Heart failure Mother   . Cancer Father        prostate cancer  . Hypertension Father   . Renal Disease Father   . Diabetes Paternal Grandfather   . Bipolar disorder Sister   . Renal  Disease Maternal Aunt     ROS:  Pertinent items are noted in HPI.  Otherwise, a comprehensive ROS was negative.  Exam:   BP 114/70   Pulse 70   Resp 16   Ht 5' 6.75" (1.695 m)   Wt 264 lb (119.7 kg)   LMP 12/16/2007 (Approximate)   BMI 41.66 kg/m  Height: 5' 6.75" (169.5 cm) Ht Readings from Last 3 Encounters:  11/05/17 5' 6.75" (1.695 m)  10/11/15 5\' 7"  (1.702 m)  09/03/14 5\' 7"  (1.702 m)    General appearance: alert, cooperative and appears stated age Head: Normocephalic, without obvious abnormality, atraumatic Neck: no adenopathy, supple, symmetrical, trachea midline and thyroid normal to inspection and  palpation Lungs: clear to auscultation bilaterally Breasts: normal appearance, no masses or tenderness, No nipple retraction or dimpling, No nipple discharge or bleeding, No axillary or supraclavicular adenopathy Heart: regular rate and rhythm Abdomen: soft, non-tender; no masses,  no organomegaly Extremities: extremities normal, atraumatic, no cyanosis or edema Skin: Skin color, texture, turgor normal. No rashes or lesions Lymph nodes: Cervical, supraclavicular, and axillary nodes normal. No abnormal inguinal nodes palpated Neurologic: Grossly normal   Pelvic: External genitalia:  no lesions              Urethra:  normal appearing urethra with no masses, tenderness or lesions              Bartholin's and Skene's: normal                 Vagina: normal appearing vagina with normal color and discharge, no lesions              Cervix: no cervical motion tenderness, no lesions and normal appearance              Pap taken: Yes.   Bimanual Exam:  Uterus:  normal size, contour, position, consistency, mobility, non-tender and mid position              Adnexa: normal no masses               Rectovaginal: Confirms               Anus:  normal sphincter tone, no lesions  Chaperone present: yes  A:  Well Woman with normal exam  Menopausal no HRT  Hypothyroid stable medication  Lamictal/clobetasol with MD management.  P:   Reviewed health and wellness pertinent to exam  Aware of need to advise if vaginal bleeding  Patient has enough medication until lab.  Lab: TSH  Continue follow up as indicated.  Pap smear: yes   counseled on breast self exam, mammography screening, feminine hygiene, adequate intake of calcium and vitamin D, diet and exercise  return annually or prn  An After Visit Summary was printed and given to the patient.

## 2017-11-06 ENCOUNTER — Other Ambulatory Visit: Payer: Self-pay | Admitting: Certified Nurse Midwife

## 2017-11-06 DIAGNOSIS — E039 Hypothyroidism, unspecified: Secondary | ICD-10-CM

## 2017-11-06 LAB — TSH: TSH: 3.7 u[IU]/mL (ref 0.450–4.500)

## 2017-11-06 MED ORDER — LEVOTHYROXINE SODIUM 100 MCG PO TABS: 100.0000 ug | ORAL_TABLET | Freq: Every day | ORAL | 3 refills | Status: DC

## 2017-11-08 LAB — CYTOLOGY - PAP
Diagnosis: NEGATIVE
HPV: NOT DETECTED

## 2017-12-03 ENCOUNTER — Encounter: Payer: Self-pay | Admitting: Certified Nurse Midwife

## 2018-01-31 ENCOUNTER — Telehealth: Payer: Self-pay

## 2018-01-31 NOTE — Telephone Encounter (Signed)
Patient in 04 recall bilateral screening for follow up. Would you please call her.

## 2018-02-04 NOTE — Telephone Encounter (Signed)
Fax received and add to recall folder and will rout message to nursing superviser Lamont Snowball RN

## 2018-02-04 NOTE — Telephone Encounter (Signed)
[facility] faxed report from 12-03-17. Routing fax along with this note to [facility]

## 2018-02-06 NOTE — Telephone Encounter (Signed)
MMG report from 12-03-17 received.  Bi- Rads1- routing screening in 1 year. Recall completed.  Report and encounter to Dr Sabra Heck. Encounter Closed.

## 2018-08-13 ENCOUNTER — Encounter: Payer: Self-pay | Admitting: Physician Assistant

## 2018-08-13 ENCOUNTER — Ambulatory Visit (INDEPENDENT_AMBULATORY_CARE_PROVIDER_SITE_OTHER): Payer: 59 | Admitting: Physician Assistant

## 2018-08-13 ENCOUNTER — Ambulatory Visit: Payer: No Typology Code available for payment source | Admitting: Physician Assistant

## 2018-08-13 ENCOUNTER — Other Ambulatory Visit: Payer: Self-pay

## 2018-08-13 DIAGNOSIS — M67471 Ganglion, right ankle and foot: Secondary | ICD-10-CM

## 2018-08-13 NOTE — Progress Notes (Signed)
   Procedure Note  Patient: Audrey Chandler             Date of Birth: 11-16-1955           MRN: [id number]             Visit Date: 08/13/2018  HPI: Mrs. Grulke is well-known to Dr. 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  She has a new knot on her right foot that she wants looked at swelling if this is parted of the Fibromatosis.  She has had no new injury.  She is also worried that she is walking more on the lateral aspect of the right foot.  Physical exam bilateral feet: No tenderness at medial tubercle calcaneus bilaterally.  She has fibromatosis changes to plantar aspect of both feet.  Right foot near the insertion of the peroneus brevis she has a mobile cystlike structure that is consistent with a ganglion cyst. Follow-up features loss of normal gait without gross evidence of supination pronation.  Arch is maintained bilaterally.   Procedures: Visit Diagnoses:  1. Ganglion cyst of right foot     Incision & Drainage  Date/Time: 08/13/2018 5:13 PM Performed by: Pete Pelt, PA-C Authorized by: Pete Pelt, PA-C   Consent:    Consent obtained:  Verbal   Consent given by:  Patient   Risks discussed:  Incomplete drainage   Alternatives discussed:  No treatment and observation Location:    Type:  Ganglion cyst   Location:  Lower extremity   Lower extremity location:  Foot Pre-procedure details:    Skin preparation:  Betadine Anesthesia (see MAR for exact dosages):    Anesthesia method:  Local infiltration   Local anesthetic:  Lidocaine 1% w/o epi Procedure type:    Complexity:  Simple Procedure details:    Needle aspiration: yes     Drainage characteristics: Gelatinous.   Drainage amount:  Moderate   Packing materials:  None Post-procedure details:    Patient tolerance of procedure:  Tolerated well, no immediate complications   Plan: Strep an Ace bandage she will leave on until this evening. She can take it off.  I did  discuss with her that the ganglion cyst may recur.  Becomes problematic it could be excised.  In regards to her shoewear would recommend neutral soft insert.  She will follow-up as needed.

## 2018-08-14 ENCOUNTER — Ambulatory Visit: Payer: No Typology Code available for payment source | Admitting: Orthopaedic Surgery

## 2018-11-07 ENCOUNTER — Ambulatory Visit: Payer: No Typology Code available for payment source | Admitting: Certified Nurse Midwife

## 2018-11-09 ENCOUNTER — Other Ambulatory Visit: Payer: Self-pay | Admitting: Certified Nurse Midwife

## 2018-11-09 DIAGNOSIS — E039 Hypothyroidism, unspecified: Secondary | ICD-10-CM

## 2018-11-10 NOTE — Telephone Encounter (Signed)
Medication refill request: Levothyroxine  Last AEX:  11/05/17 Next AEX: 11/19/18 Last MMG (if hormonal medication request): 12/03/17 Bi-rads 1 neg  Refill authorized: #30 with 0 RF

## 2018-11-17 ENCOUNTER — Other Ambulatory Visit: Payer: Self-pay

## 2018-11-19 ENCOUNTER — Ambulatory Visit (INDEPENDENT_AMBULATORY_CARE_PROVIDER_SITE_OTHER): Payer: 59 | Admitting: Certified Nurse Midwife

## 2018-11-19 ENCOUNTER — Other Ambulatory Visit: Payer: Self-pay

## 2018-11-19 ENCOUNTER — Encounter: Payer: Self-pay | Admitting: Certified Nurse Midwife

## 2018-11-19 VITALS — BP 120/84 | HR 70 | Temp 97.1°F | Resp 16 | Ht 66.75 in | Wt 264.0 lb

## 2018-11-19 DIAGNOSIS — Z01419 Encounter for gynecological examination (general) (routine) without abnormal findings: Secondary | ICD-10-CM | POA: Diagnosis not present

## 2018-11-19 NOTE — Patient Instructions (Signed)

## 2018-11-19 NOTE — Progress Notes (Signed)
63 y.o. [id number] Widowed  Caucasian Fe here for annual exam. Menopausal denies vaginal bleeding or vaginal dryness. Patient had flare of joint pain and took medication with good relief. Taking Lexapro now for antidepressant, which is working well. Sees PCP for labs, hypothyroid, Lamictal management. No other health issues today.  Patient's last menstrual period was 12/16/2007 (approximate).          Sexually active: No.  The current method of family planning is post menopausal status.    Exercising: No.  exercise Smoker:  no  Review of Systems  Constitutional: Negative.   HENT: Negative.   Eyes: Negative.   Respiratory: Negative.   Cardiovascular: Negative.   Gastrointestinal: Negative.   Genitourinary: Negative.   Musculoskeletal: Negative.   Skin: Negative.   Neurological: Negative.   Endo/Heme/Allergies: Negative.   Psychiatric/Behavioral: Negative.     Health Maintenance: Pap:  11-05-17 neg HPV HR neg History of Abnormal Pap: no MMG:  12-03-17 category b density birads 1:neg Self Breast exams: no Colonoscopy:  2010 f/u 33yrs BMD:   2017 osteopenia, pcp manages TDaP:  2000 declines, had at PCP Shingles: not done Pneumonia: not done Hep C and HIV: both neg 2017 Labs: if needed   reports that she has quit smoking. She has a 1.00 pack-year smoking history. She has never used smokeless tobacco. She reports current alcohol use of about 1.0 standard drinks of alcohol per week. She reports that she does not use drugs.  Past Medical History:  Diagnosis Date  . Arthritis   . Closed fracture dislocation of multiple fingers 7th grade   left hand 4th & 5th fingers  . Fracture of wrist, closed 6th grade   right  . Hypothyroidism   . Seizure disorder ([place]) 2010  . Seizures ([place])   . Shingles   . Thyroid disease 06/2008   hypothyroidism  . Vitamin D deficiency     Past Surgical History:  Procedure Laterality Date  . KNEE ARTHROSCOPY Right 2010  . TOTAL HIP ARTHROPLASTY Left  06/19/2013   Procedure: LEFT TOTAL HIP ARTHROPLASTY ANTERIOR APPROACH;  Surgeon: Mcarthur Rossetti, MD;  Location: [facility];  Service: Orthopedics;  Laterality: Left;  Marland Kitchen VAGINAL DELIVERY  x3  . WISDOM TOOTH EXTRACTION  age 58    Current Outpatient Medications  Medication Sig Dispense Refill  . clobetasol (TEMOVATE) 0.05 % external solution Apply 1 application topically 2 (two) times daily.    Marland Kitchen doxycycline (MONODOX) 100 MG capsule Take by mouth daily.    Marland Kitchen escitalopram (LEXAPRO) 10 MG tablet Take 10 mg by mouth daily.    Marland Kitchen lamoTRIgine (LAMICTAL) 100 MG tablet Take 100 mg by mouth 2 (two) times daily.    Marland Kitchen levothyroxine (SYNTHROID) 100 MCG tablet TAKE 1 TABLET BY MOUTH EVERY DAY BEFORE BREKAFAST 30 tablet 0  . metroNIDAZOLE (METROGEL) 1 % gel APPLY TOPICALLY DAILY. 55 g 0  . nabumetone (RELAFEN) 750 MG tablet TAKE 1 TABLET TWICE A DAY AS NEEDED 60 tablet 0  . UNABLE TO FIND cbd gummies 1 daily     No current facility-administered medications for this visit.     Family History  Problem Relation Age of Onset  . Pulmonary disease Mother   . Heart failure Mother   . Cancer Father        prostate cancer  . Hypertension Father   . Renal Disease Father   . Diabetes Paternal Grandfather   . Bipolar disorder Sister   . Renal Disease Maternal Aunt  ROS:  Pertinent items are noted in HPI.  Otherwise, a comprehensive ROS was negative.  Exam:   BP 120/84   Pulse 70   Temp (!) 97.1 F (36.2 C) (Skin)   Resp 16   Ht 5' 6.75" (1.695 m)   Wt 264 lb (119.7 kg)   LMP 12/16/2007 (Approximate)   BMI 41.66 kg/m  Height: 5' 6.75" (169.5 cm) Ht Readings from Last 3 Encounters:  11/19/18 5' 6.75" (1.695 m)  11/05/17 5' 6.75" (1.695 m)  10/11/15 5\' 7"  (1.702 m)    General appearance: alert, cooperative and appears stated age Head: Normocephalic, without obvious abnormality, atraumatic Neck: no adenopathy, supple, symmetrical, trachea midline and thyroid normal to inspection and  palpation Lungs: clear to auscultation bilaterally Breasts: normal appearance, no masses or tenderness, No nipple retraction or dimpling, No nipple discharge or bleeding, No axillary or supraclavicular adenopathy Heart: regular rate and rhythm Abdomen: soft, non-tender; no masses,  no organomegaly Extremities: extremities normal, atraumatic, no cyanosis or edema Skin: Skin color, texture, turgor normal. No rashes or lesions Lymph nodes: Cervical, supraclavicular, and axillary nodes normal. No abnormal inguinal nodes palpated Neurologic: Grossly normal   Pelvic: External genitalia:  no lesions              Urethra:  normal appearing urethra with no masses, tenderness or lesions              Bartholin's and Skene's: normal                 Vagina: normal appearing vagina with normal color and discharge, no lesions              Cervix: no cervical motion tenderness, no lesions and normal appearance              Pap taken: No. Bimanual Exam:  Uterus:  normal size, contour, position, consistency, mobility, non-tender and anteverted              Adnexa: normal adnexa and no mass, fullness, tenderness               Rectovaginal: Confirms               Anus:  normal sphincter tone, no lesions  Chaperone present: yes  A:  Well Woman with normal exam  Post menopausal no HRT  Hypothyroid and Lexapro management with PCP  Mammogram due patient to schedule  P:   Reviewed health and wellness pertinent to exam  Patient aware to call if vaginal bleeding occurs.  Continue follow up with PCP as indicated.  Pap smear: no  Reviewed need for SBE, yearly mammogram, regular exercise and good diet with calcium and Vitamin D   return annually or prn  An After Visit Summary was printed and given to the patient.

## 2018-12-03 ENCOUNTER — Other Ambulatory Visit: Payer: Self-pay | Admitting: Certified Nurse Midwife

## 2018-12-03 DIAGNOSIS — E039 Hypothyroidism, unspecified: Secondary | ICD-10-CM

## 2018-12-03 NOTE — Telephone Encounter (Signed)
Spoke with pt. Pt states wanting med refill for Synthroid. States wasn't sure if D. Hollice Espy, CNM was going to refill or PCP. No TSH in 2020 drawn in office and usually managed by PCP per OV on 11/19/18.  Rx pended for #30 with 0 RF if approved.   Will route to D. Hollice Espy, CNM for recommendations.

## 2018-12-04 ENCOUNTER — Other Ambulatory Visit: Payer: Self-pay | Admitting: Certified Nurse Midwife

## 2018-12-04 DIAGNOSIS — E038 Other specified hypothyroidism: Secondary | ICD-10-CM

## 2018-12-04 NOTE — Telephone Encounter (Signed)
Will refill for her, she needs to come by office for TSH lab. Order placed for lab

## 2018-12-04 NOTE — Telephone Encounter (Signed)
Left message to call Murlean Hark, RN at [facility] to schedule TSH lab.

## 2018-12-05 NOTE — Telephone Encounter (Signed)
Called pt. Pt verified that had PCP draw TSH level in last 30 days and will call PCP and get TSH labs faxed to our office for Audrey Chandler, CNM to review. Pt agreeable and thankful for Synthroid Rx.  Routing to provider for final review. Patient is agreeable to disposition. Will close encounter.

## 2018-12-27 DIAGNOSIS — Z1231 Encounter for screening mammogram for malignant neoplasm of breast: Secondary | ICD-10-CM | POA: Diagnosis not present

## 2019-01-07 ENCOUNTER — Other Ambulatory Visit: Payer: Self-pay | Admitting: Certified Nurse Midwife

## 2019-01-07 DIAGNOSIS — E039 Hypothyroidism, unspecified: Secondary | ICD-10-CM

## 2019-01-26 ENCOUNTER — Telehealth: Payer: Self-pay | Admitting: Certified Nurse Midwife

## 2019-01-26 NOTE — Telephone Encounter (Signed)
Agree with plan, if she is going back and forth needs to have one provider managing. PCP is fine, that is what she told me the plan was.

## 2019-01-26 NOTE — Telephone Encounter (Signed)
Patient sent the following message through [facility]. Routing to triage to assist patient with request.  [place], [facility]  Phone Number: [phone]  My refill for Levothyroxine has expired but I have a concern with it anyway. My hair has been falling out more than normal for almost a year. My hairdresser who commented on the amount of shedding over several visits said it could be the dosage of the thyroid medicine. I ran out of the Rx a few days ago & my hair is not shedding now.    I used to be on 23 MCG but my current prescription is for 100 MCG. Should my dosage be changed? And should I get it refilled at all?   Thank you - Audrey Chandler

## 2019-01-26 NOTE — Telephone Encounter (Signed)
Spoke with patient. Patient reports increased hair loss over the past year. Denies any other GYN symptoms. Patient has been out of synthroid for almost one wk now, has noticed increase in hair loss.   Per review of last AEX 11/19/18 notes, hypothyroid management with PCP, no labs. RX was sent for #30/1RF for synthroid.   Patient states she is unsure who she wants to manage her thyroid at this point. States she thinks that she was seen by her PCP after her AEX, but is unsure. She has called PCP and is waiting for a return call.   Advised patient if she wants GYN to manage thyroid, OV needed for further evaluation and updated TSH labs. Advised patient there are other things such as low Vit D can cause hair loss.  Patient declines OV at this time, will wait for return call from PCP. Patient will return call if OV desired.  Advised I will review with Audrey Chandler, CNM and return call if any additional recommendations. Patient agreeable.   Audrey Chandler, CNM -please review.

## 2019-01-26 NOTE — Telephone Encounter (Signed)
Encounter closed

## 2019-02-25 DIAGNOSIS — E039 Hypothyroidism, unspecified: Secondary | ICD-10-CM | POA: Diagnosis not present

## 2019-02-25 DIAGNOSIS — Z7189 Other specified counseling: Secondary | ICD-10-CM | POA: Diagnosis not present

## 2019-04-07 ENCOUNTER — Encounter: Payer: Self-pay | Admitting: Certified Nurse Midwife

## 2019-04-10 DIAGNOSIS — E039 Hypothyroidism, unspecified: Secondary | ICD-10-CM | POA: Diagnosis not present

## 2019-04-14 DIAGNOSIS — H2513 Age-related nuclear cataract, bilateral: Secondary | ICD-10-CM | POA: Diagnosis not present

## 2019-04-15 DIAGNOSIS — E039 Hypothyroidism, unspecified: Secondary | ICD-10-CM | POA: Diagnosis not present

## 2019-04-15 DIAGNOSIS — Z7189 Other specified counseling: Secondary | ICD-10-CM | POA: Diagnosis not present

## 2019-06-01 DIAGNOSIS — M62838 Other muscle spasm: Secondary | ICD-10-CM | POA: Diagnosis not present

## 2019-07-15 DIAGNOSIS — H6120 Impacted cerumen, unspecified ear: Secondary | ICD-10-CM | POA: Diagnosis not present

## 2019-08-18 DIAGNOSIS — E039 Hypothyroidism, unspecified: Secondary | ICD-10-CM | POA: Diagnosis not present

## 2019-09-10 DIAGNOSIS — E039 Hypothyroidism, unspecified: Secondary | ICD-10-CM | POA: Diagnosis not present

## 2019-11-23 ENCOUNTER — Ambulatory Visit: Payer: 59 | Admitting: Certified Nurse Midwife

## 2019-11-23 DIAGNOSIS — R7301 Impaired fasting glucose: Secondary | ICD-10-CM | POA: Diagnosis not present

## 2019-11-23 DIAGNOSIS — Z0000 Encounter for general adult medical examination without abnormal findings: Secondary | ICD-10-CM | POA: Diagnosis not present

## 2019-11-23 DIAGNOSIS — E559 Vitamin D deficiency, unspecified: Secondary | ICD-10-CM | POA: Diagnosis not present

## 2019-11-23 DIAGNOSIS — E039 Hypothyroidism, unspecified: Secondary | ICD-10-CM | POA: Diagnosis not present

## 2019-11-30 DIAGNOSIS — M1990 Unspecified osteoarthritis, unspecified site: Secondary | ICD-10-CM | POA: Diagnosis not present

## 2019-11-30 DIAGNOSIS — Z0000 Encounter for general adult medical examination without abnormal findings: Secondary | ICD-10-CM | POA: Diagnosis not present

## 2019-11-30 DIAGNOSIS — L719 Rosacea, unspecified: Secondary | ICD-10-CM | POA: Diagnosis not present

## 2019-11-30 DIAGNOSIS — Z23 Encounter for immunization: Secondary | ICD-10-CM | POA: Diagnosis not present

## 2019-12-31 ENCOUNTER — Ambulatory Visit: Payer: Medicare Other | Admitting: Sports Medicine

## 2019-12-31 ENCOUNTER — Encounter: Payer: Self-pay | Admitting: Sports Medicine

## 2019-12-31 ENCOUNTER — Ambulatory Visit (INDEPENDENT_AMBULATORY_CARE_PROVIDER_SITE_OTHER): Payer: Medicare Other

## 2019-12-31 ENCOUNTER — Other Ambulatory Visit: Payer: Self-pay

## 2019-12-31 DIAGNOSIS — E042 Nontoxic multinodular goiter: Secondary | ICD-10-CM | POA: Insufficient documentation

## 2019-12-31 DIAGNOSIS — M79671 Pain in right foot: Secondary | ICD-10-CM | POA: Diagnosis not present

## 2019-12-31 DIAGNOSIS — E039 Hypothyroidism, unspecified: Secondary | ICD-10-CM | POA: Insufficient documentation

## 2019-12-31 DIAGNOSIS — M7989 Other specified soft tissue disorders: Secondary | ICD-10-CM | POA: Diagnosis not present

## 2019-12-31 DIAGNOSIS — M67471 Ganglion, right ankle and foot: Secondary | ICD-10-CM

## 2019-12-31 DIAGNOSIS — F418 Other specified anxiety disorders: Secondary | ICD-10-CM | POA: Insufficient documentation

## 2019-12-31 DIAGNOSIS — M659 Synovitis and tenosynovitis, unspecified: Secondary | ICD-10-CM

## 2019-12-31 DIAGNOSIS — M1990 Unspecified osteoarthritis, unspecified site: Secondary | ICD-10-CM | POA: Insufficient documentation

## 2019-12-31 DIAGNOSIS — Z87898 Personal history of other specified conditions: Secondary | ICD-10-CM | POA: Insufficient documentation

## 2019-12-31 DIAGNOSIS — M47812 Spondylosis without myelopathy or radiculopathy, cervical region: Secondary | ICD-10-CM | POA: Insufficient documentation

## 2019-12-31 DIAGNOSIS — L719 Rosacea, unspecified: Secondary | ICD-10-CM | POA: Insufficient documentation

## 2019-12-31 DIAGNOSIS — M779 Enthesopathy, unspecified: Secondary | ICD-10-CM | POA: Diagnosis not present

## 2019-12-31 DIAGNOSIS — L309 Dermatitis, unspecified: Secondary | ICD-10-CM | POA: Insufficient documentation

## 2019-12-31 DIAGNOSIS — E559 Vitamin D deficiency, unspecified: Secondary | ICD-10-CM | POA: Insufficient documentation

## 2019-12-31 NOTE — Progress Notes (Signed)
Subjective: Audrey Chandler is a 64 y.o. female patient who presents to office for evaluation of ocassional right foot pain. Patient complains of progressive change is size to mass of right foot at side that is difficult to fit in shoes.  Reports that she has tried aspiration but it keeps coming back and now larger.  Patient denies any other pedal complaints. Denies recent injury/trip/fall/sprain/any other causative factors but does admit to a history of orthopedic surgery and during physical therapy was walking on uneven surfaces and then started to notice the cyst appear.  Patient also reports that he has a history of Ledderhose disease.  Review of Systems  All other systems reviewed and are negative.    Patient Active Problem List   Diagnosis Date Noted  . Unilateral primary osteoarthritis, right knee 12/12/2016  . History of left hip replacement 12/12/2016  . Chronic pain of right knee 12/12/2016  . Arthritis of left hip 06/19/2013  . Status post THR (total hip replacement) 06/19/2013    Current Outpatient Medications on File Prior to Visit  Medication Sig Dispense Refill  . clobetasol (TEMOVATE) 0.05 % external solution Apply 1 application topically 2 (two) times daily.    Marland Kitchen doxycycline (MONODOX) 100 MG capsule Take by mouth daily.    Marland Kitchen escitalopram (LEXAPRO) 10 MG tablet Take 10 mg by mouth daily.    Marland Kitchen lamoTRIgine (LAMICTAL) 100 MG tablet Take 100 mg by mouth 2 (two) times daily.    Marland Kitchen levothyroxine (SYNTHROID) 100 MCG tablet TAKE 1 TABLET BY MOUTH EVERY DAY BEFORE BREKAFAST 30 tablet 1  . metroNIDAZOLE (METROGEL) 1 % gel APPLY TOPICALLY DAILY. 55 g 0  . nabumetone (RELAFEN) 750 MG tablet TAKE 1 TABLET TWICE A DAY AS NEEDED 60 tablet 0  . UNABLE TO FIND cbd gummies 1 daily     No current facility-administered medications on file prior to visit.    No Known Allergies  Objective:  General: Alert and oriented x3 in no acute distress  Dermatology: No open lesions bilateral lower  extremities, no webspace macerations, no ecchymosis bilateral, all nails x 10 are well manicured. + raised soft tissue mass at the dorsal lateral right foot that is freely movable consistent with cyst with multiple locules that measures 2x4.5cm.    Vascular: Dorsalis Pedis and Posterior Tibial pedal pulses palpable, Capillary Fill Time 3 seconds,(+) pedal hair growth bilateral, no edema bilateral lower extremities, Temperature gradient within normal limits.  Neurology: Gross sensation intact via light touch bilateral.  Musculoskeletal: Mild tenderness with palpation at right foot. Strength within normal limits in all groups bilateral.   Gait: Minimal Antalgic gait  Xrays  Right Foot   Impression: bone spur and joint space narrowing at 5th met base on right and soft tissue swelling consistent with mass.  Assessment and Plan: Problem List Items Addressed This Visit   None   Visit Diagnoses    Pain in right foot    -  Primary   Relevant Orders   DG Foot Complete Right   Soft tissue mass       Ganglion cyst of right foot       Bone spur       Tenosynovitis of right foot           -Complete examination performed -Xrays reviewed -Discussed treatement options for re-current mass likely cyst with bone spur and possible tenosynvitis  -Patient opt for surgical management. Consent obtained for excision of mass, removal of spur and possible tendon repair  on the right.  Pre and Post op course explained. Risks, benefits, alternatives explained. No guarantees given or implied. Surgical booking slip submitted and provided patient with Surgical packet and info for [facility] -To dispense CAM boot at the surgery center -Patient to return to office after surgery or sooner if condition worsens.  Landis Martins, DPM

## 2020-02-12 ENCOUNTER — Telehealth: Payer: Self-pay

## 2020-02-12 NOTE — Telephone Encounter (Signed)
DOS 02/22/2020  EXC GANGLION CYST RT - 28090 REPAIR TENDON RT - 28086 TARSAL EXOSTECTOMY 5TH RT - 28104  [facility] MEDICARE EFFECTIVE DATE - 01/16/2020  PLAN DEDUCTIBLE - $0.00 OUT OF POCKET - $4500.00 W/ $4500.00 REMAINING  CO-INSURANCE 0% / Day OUTPATIENT SURGERY 0% / [place] $325 / Day OUTPATIENT SURGERY $325 / [place]  Notification or Prior Authorization is not required for the requested services  Decision ID #:[id number]

## 2020-02-21 ENCOUNTER — Other Ambulatory Visit: Payer: Self-pay | Admitting: Sports Medicine

## 2020-02-21 NOTE — Progress Notes (Signed)
Post op meds entered 

## 2020-02-22 ENCOUNTER — Encounter: Payer: Self-pay | Admitting: Sports Medicine

## 2020-02-22 DIAGNOSIS — M25571 Pain in right ankle and joints of right foot: Secondary | ICD-10-CM | POA: Diagnosis not present

## 2020-02-22 DIAGNOSIS — M85871 Other specified disorders of bone density and structure, right ankle and foot: Secondary | ICD-10-CM | POA: Diagnosis not present

## 2020-02-22 DIAGNOSIS — M65871 Other synovitis and tenosynovitis, right ankle and foot: Secondary | ICD-10-CM | POA: Diagnosis not present

## 2020-02-22 DIAGNOSIS — M67471 Ganglion, right ankle and foot: Secondary | ICD-10-CM | POA: Diagnosis not present

## 2020-02-22 DIAGNOSIS — D2121 Benign neoplasm of connective and other soft tissue of right lower limb, including hip: Secondary | ICD-10-CM | POA: Diagnosis not present

## 2020-02-22 DIAGNOSIS — M25774 Osteophyte, right foot: Secondary | ICD-10-CM | POA: Diagnosis not present

## 2020-02-22 MED ORDER — IBUPROFEN 800 MG PO TABS: 800.0000 mg | ORAL_TABLET | Freq: Three times a day (TID) | ORAL | 0 refills | Status: DC | PRN

## 2020-02-22 MED ORDER — DOCUSATE SODIUM 100 MG PO CAPS: 100.0000 mg | ORAL_CAPSULE | Freq: Two times a day (BID) | ORAL | 0 refills | Status: AC

## 2020-02-22 MED ORDER — HYDROCODONE-ACETAMINOPHEN 10-325 MG PO TABS: 1.0000 | ORAL_TABLET | Freq: Four times a day (QID) | ORAL | 0 refills | Status: AC | PRN

## 2020-02-22 MED ORDER — PROMETHAZINE HCL 12.5 MG PO TABS: 12.5000 mg | ORAL_TABLET | Freq: Three times a day (TID) | ORAL | 0 refills | Status: AC | PRN

## 2020-02-25 ENCOUNTER — Encounter: Payer: Self-pay | Admitting: Sports Medicine

## 2020-02-29 ENCOUNTER — Telehealth: Payer: Self-pay | Admitting: Sports Medicine

## 2020-02-29 NOTE — Telephone Encounter (Signed)
Can you call patient to see what post op ?s she has about pain and her boot? Thanks Dr. [name]

## 2020-02-29 NOTE — Telephone Encounter (Signed)
Patient had questions about the pain in her foot and the boot she is wearing. No other information was left on the nurse line regarding the nature of her questions.

## 2020-03-03 ENCOUNTER — Telehealth: Payer: Self-pay | Admitting: *Deleted

## 2020-03-03 ENCOUNTER — Encounter: Payer: Medicare Other | Admitting: Sports Medicine

## 2020-03-03 NOTE — Telephone Encounter (Signed)
Patient called and left a message about the surgery boot and I called the patient back on 03-01-2020 and the boot seems to be irritating and did not take any of the medicines and patient stated that she may not be able to get to the [place] office due to family issues and I stated to call me back Wednesday to make sure she could get to the [place] office but never did hear back from the patient. Lattie Haw

## 2020-03-04 NOTE — Telephone Encounter (Signed)
Per Dr [name] I called and spoke with the patient due to patient do not show up for the first post op visit and the patient stated that she was doing pretty good and that she was ready to get out of this boot and patient is scheduled to see Dr [name] on 03-10-2020 at 9:45 am. Audrey Chandler

## 2020-03-09 ENCOUNTER — Other Ambulatory Visit: Payer: Self-pay | Admitting: Sports Medicine

## 2020-03-09 DIAGNOSIS — M67471 Ganglion, right ankle and foot: Secondary | ICD-10-CM

## 2020-03-10 ENCOUNTER — Encounter: Payer: Self-pay | Admitting: Sports Medicine

## 2020-03-10 ENCOUNTER — Ambulatory Visit (INDEPENDENT_AMBULATORY_CARE_PROVIDER_SITE_OTHER): Payer: Medicare Other | Admitting: Sports Medicine

## 2020-03-10 ENCOUNTER — Other Ambulatory Visit: Payer: Self-pay

## 2020-03-10 ENCOUNTER — Ambulatory Visit (INDEPENDENT_AMBULATORY_CARE_PROVIDER_SITE_OTHER): Payer: Medicare Other

## 2020-03-10 DIAGNOSIS — M659 Synovitis and tenosynovitis, unspecified: Secondary | ICD-10-CM

## 2020-03-10 DIAGNOSIS — E039 Hypothyroidism, unspecified: Secondary | ICD-10-CM | POA: Diagnosis not present

## 2020-03-10 DIAGNOSIS — M65971 Unspecified synovitis and tenosynovitis, right ankle and foot: Secondary | ICD-10-CM

## 2020-03-10 DIAGNOSIS — Z9889 Other specified postprocedural states: Secondary | ICD-10-CM

## 2020-03-10 DIAGNOSIS — M79671 Pain in right foot: Secondary | ICD-10-CM

## 2020-03-10 DIAGNOSIS — M7989 Other specified soft tissue disorders: Secondary | ICD-10-CM

## 2020-03-10 DIAGNOSIS — M779 Enthesopathy, unspecified: Secondary | ICD-10-CM

## 2020-03-10 DIAGNOSIS — M67471 Ganglion, right ankle and foot: Secondary | ICD-10-CM

## 2020-03-10 MED ORDER — IBUPROFEN 800 MG PO TABS: 800.0000 mg | ORAL_TABLET | Freq: Three times a day (TID) | ORAL | 0 refills | Status: AC | PRN

## 2020-03-10 NOTE — Progress Notes (Signed)
Subjective: Audrey Chandler is a 65 y.o. female patient seen today in office for POV #1 (DOS 02/22/2020), S/P excision of soft tissue mass right foot. Patient denies pain at surgical site, denies calf pain, denies headache, chest pain, shortness of breath, nausea, vomiting, fever, or chills.  Patient admits that she was unable to attend her first postop visit due to not having transportation.  No other issues noted.   Patient Active Problem List   Diagnosis Date Noted  . Cervical spondylosis 12/31/2019  . Arthritis 12/31/2019  . Dermatitis 12/31/2019  . Hypothyroidism 12/31/2019  . Mixed anxiety and depressive disorder 12/31/2019  . Morbid obesity ([place]) 12/31/2019  . Non-toxic multinodular goiter 12/31/2019  . Personal history of other specified conditions 12/31/2019  . Rosacea 12/31/2019  . Vitamin D deficiency 12/31/2019  . Unilateral primary osteoarthritis, right knee 12/12/2016  . History of left hip replacement 12/12/2016  . Chronic pain of right knee 12/12/2016  . Arthritis of left hip 06/19/2013  . Status post THR (total hip replacement) 06/19/2013    Current Outpatient Medications on File Prior to Visit  Medication Sig Dispense Refill  . acetaminophen (TYLENOL) 500 MG tablet 1 tablet as needed    . Calcium Carb-Cholecalciferol (CALCIUM + D3) 600-800 MG-UNIT TABS 1 tablet with a meal    . clobetasol (TEMOVATE) 0.05 % external solution Apply 1 application topically 2 (two) times daily.    . cyclobenzaprine (FLEXERIL) 5 MG tablet 1-2 tablets at bedtime as needed    . docusate sodium (COLACE) 100 MG capsule Take 1 capsule (100 mg total) by mouth 2 (two) times daily. 10 capsule 0  . doxycycline (MONODOX) 100 MG capsule Take by mouth daily.    Marland Kitchen escitalopram (LEXAPRO) 10 MG tablet Take 10 mg by mouth daily.    . fexofenadine (ALLEGRA) 180 MG tablet 1 tablet    . lamoTRIgine (LAMICTAL) 100 MG tablet Take 100 mg by mouth 2 (two) times daily.    Marland Kitchen levothyroxine (SYNTHROID) 100 MCG  tablet TAKE 1 TABLET BY MOUTH EVERY DAY BEFORE BREKAFAST 30 tablet 1  . metroNIDAZOLE (METROGEL) 1 % gel APPLY TOPICALLY DAILY. 55 g 0  . nabumetone (RELAFEN) 750 MG tablet TAKE 1 TABLET TWICE A DAY AS NEEDED 60 tablet 0  . promethazine (PHENERGAN) 12.5 MG tablet Take 1 tablet (12.5 mg total) by mouth every 8 (eight) hours as needed for nausea or vomiting. 20 tablet 0  . UNABLE TO FIND cbd gummies 1 daily     No current facility-administered medications on file prior to visit.    Allergies  Allergen Reactions  . Other     Other reaction(s): congestion, sneezing    Objective: There were no vitals filed for this visit.  General: No acute distress, AAOx3  Right foot: Staples and sutures intact with no gapping or dehiscence at surgical site, mild swelling to right foot, no erythema, no warmth, no drainage, no signs of infection noted, Capillary fill time <3 seconds in all digits, gross sensation present via light touch to right foot. No pain or crepitation with range of motion right foot.  No pain with calf compression.   Post Op Xray, right foot: No acute osseous findings soft tissue swelling within normal limits for post op status.   Assessment and Plan:  Problem List Items Addressed This Visit   None   Visit Diagnoses    Status post right foot surgery    -  Primary   Soft tissue mass  Relevant Orders   DG Foot Complete Right (Completed)   Ganglion cyst of right foot       Pain in right foot       Bone spur       Tenosynovitis of right foot           -Patient seen and evaluated -X-rays reviewed -Applied dry sterile dressing to surgical site right foot secured with ACE wrap and stockinet  -Advised patient to make sure to keep dressings clean, dry, and intact to right surgical site, removing the ACE as needed  -Advised patient to continue with exchange postoperative boot for a postoperative shoe since patient was complaining of boot being heavy and worried about falling  due to heavy nature of boot -Advised patient to continue with limited ambulation with use cane -Advised patient to limit activity to necessity  -Advised patient to ice and elevate as instructed -Awaiting intraoperative pathology results -Will plan for possible suture removal at next office visit. In the meantime, patient to call office if any issues or problems arise.   Landis Martins, DPM

## 2020-03-17 ENCOUNTER — Encounter: Payer: Self-pay | Admitting: Sports Medicine

## 2020-03-17 ENCOUNTER — Other Ambulatory Visit: Payer: Self-pay

## 2020-03-17 ENCOUNTER — Ambulatory Visit (INDEPENDENT_AMBULATORY_CARE_PROVIDER_SITE_OTHER): Payer: Medicare Other | Admitting: Sports Medicine

## 2020-03-17 DIAGNOSIS — Z9889 Other specified postprocedural states: Secondary | ICD-10-CM

## 2020-03-17 DIAGNOSIS — M7989 Other specified soft tissue disorders: Secondary | ICD-10-CM

## 2020-03-17 DIAGNOSIS — M67471 Ganglion, right ankle and foot: Secondary | ICD-10-CM

## 2020-03-17 DIAGNOSIS — M79671 Pain in right foot: Secondary | ICD-10-CM

## 2020-03-17 NOTE — Progress Notes (Signed)
Subjective: Audrey Chandler is a 65 y.o. female patient seen today in office for POV #2 (DOS 02/22/2020), S/P excision of soft tissue mass right foot. Patient denies pain at surgical site, denies calf pain, denies headache, chest pain, shortness of breath, nausea, vomiting, fever, or chills.  Patient reports that she is doing good however she will be moving soon out of state she will be here until April.  No other issues noted.   Patient Active Problem List   Diagnosis Date Noted  . Cervical spondylosis 12/31/2019  . Arthritis 12/31/2019  . Dermatitis 12/31/2019  . Hypothyroidism 12/31/2019  . Mixed anxiety and depressive disorder 12/31/2019  . Morbid obesity ([place]) 12/31/2019  . Non-toxic multinodular goiter 12/31/2019  . Personal history of other specified conditions 12/31/2019  . Rosacea 12/31/2019  . Vitamin D deficiency 12/31/2019  . Unilateral primary osteoarthritis, right knee 12/12/2016  . History of left hip replacement 12/12/2016  . Chronic pain of right knee 12/12/2016  . Arthritis of left hip 06/19/2013  . Status post THR (total hip replacement) 06/19/2013    Current Outpatient Medications on File Prior to Visit  Medication Sig Dispense Refill  . acetaminophen (TYLENOL) 500 MG tablet 1 tablet as needed    . Calcium Carb-Cholecalciferol (CALCIUM + D3) 600-800 MG-UNIT TABS 1 tablet with a meal    . clobetasol (TEMOVATE) 0.05 % external solution Apply 1 application topically 2 (two) times daily.    . cyclobenzaprine (FLEXERIL) 5 MG tablet 1-2 tablets at bedtime as needed    . docusate sodium (COLACE) 100 MG capsule Take 1 capsule (100 mg total) by mouth 2 (two) times daily. 10 capsule 0  . doxycycline (MONODOX) 100 MG capsule Take by mouth daily.    Marland Kitchen escitalopram (LEXAPRO) 10 MG tablet Take 10 mg by mouth daily.    . fexofenadine (ALLEGRA) 180 MG tablet 1 tablet    . ibuprofen (ADVIL) 800 MG tablet Take 1 tablet (800 mg total) by mouth every 8 (eight) hours as needed. 30  tablet 0  . lamoTRIgine (LAMICTAL) 100 MG tablet Take 100 mg by mouth 2 (two) times daily.    Marland Kitchen levothyroxine (SYNTHROID) 100 MCG tablet TAKE 1 TABLET BY MOUTH EVERY DAY BEFORE BREKAFAST 30 tablet 1  . metroNIDAZOLE (METROGEL) 1 % gel APPLY TOPICALLY DAILY. 55 g 0  . nabumetone (RELAFEN) 750 MG tablet TAKE 1 TABLET TWICE A DAY AS NEEDED 60 tablet 0  . promethazine (PHENERGAN) 12.5 MG tablet Take 1 tablet (12.5 mg total) by mouth every 8 (eight) hours as needed for nausea or vomiting. 20 tablet 0  . UNABLE TO FIND cbd gummies 1 daily     No current facility-administered medications on file prior to visit.    Allergies  Allergen Reactions  . Other     Other reaction(s): congestion, sneezing    Objective: There were no vitals filed for this visit.  General: No acute distress, AAOx3  Right foot: Staples and sutures intact with no gapping or dehiscence at surgical site, mild swelling to right foot, no erythema, no warmth, no drainage, no signs of infection noted, Capillary fill time <3 seconds in all digits, gross sensation present via light touch to right foot. No pain or crepitation with range of motion right foot.  No pain with calf compression.    Assessment and Plan:  Problem List Items Addressed This Visit   None   Visit Diagnoses    Status post right foot surgery    -  Primary   Soft tissue mass       Ganglion cyst of right foot       Pain in right foot           -Patient seen and evaluated -Pathology results reviewed with patient consistent with ganglion cyst -Sutures and staples removed  -Applied dry sterile dressing to surgical site right foot secured with ACE wrap and stockinet  -Advised patient to make sure to keep dressings clean, dry, and intact to right surgical site, until tomorrow.  On tomorrow may remove and shower and redress as needed with an Ace wrap for swelling. -Advised patient now that her sutures and stitches are out may transition to a normal shoe as  long as the shoe does not rub the lateral incision -Advised patient to continue with limited ambulation with use cane as needed -Advised patient to limit activity to necessity  -Advised patient to ice and elevate as instructed -Will plan for incision check at next office visit. In the meantime, patient to call office if any issues or problems arise.   Landis Martins, DPM

## 2020-03-24 ENCOUNTER — Encounter: Payer: Medicare Other | Admitting: Sports Medicine

## 2020-03-31 ENCOUNTER — Encounter: Payer: Self-pay | Admitting: Sports Medicine

## 2020-03-31 ENCOUNTER — Other Ambulatory Visit: Payer: Self-pay

## 2020-03-31 ENCOUNTER — Ambulatory Visit (INDEPENDENT_AMBULATORY_CARE_PROVIDER_SITE_OTHER): Payer: Medicare Other | Admitting: Sports Medicine

## 2020-03-31 DIAGNOSIS — M7989 Other specified soft tissue disorders: Secondary | ICD-10-CM

## 2020-03-31 DIAGNOSIS — Z1231 Encounter for screening mammogram for malignant neoplasm of breast: Secondary | ICD-10-CM | POA: Diagnosis not present

## 2020-03-31 DIAGNOSIS — M79671 Pain in right foot: Secondary | ICD-10-CM

## 2020-03-31 DIAGNOSIS — Z9889 Other specified postprocedural states: Secondary | ICD-10-CM

## 2020-03-31 DIAGNOSIS — Z6841 Body Mass Index (BMI) 40.0 and over, adult: Secondary | ICD-10-CM | POA: Insufficient documentation

## 2020-03-31 DIAGNOSIS — M67471 Ganglion, right ankle and foot: Secondary | ICD-10-CM

## 2020-03-31 NOTE — Progress Notes (Signed)
Subjective: Audrey Chandler is a 65 y.o. female patient seen today in office for POV #2 (DOS 02/22/2020), S/P excision of soft tissue mass right foot. Patient denies pain at surgical site, denies calf pain, denies headache, chest pain, shortness of breath, nausea, vomiting, fever, or chills.  Patient reports that she is doing good however she will be moving soon out of state she will be here until April.  No other issues noted.   Patient Active Problem List   Diagnosis Date Noted  . Body mass index (BMI) 40.0-44.9, adult ([place]) 03/31/2020  . Cervical spondylosis 12/31/2019  . Arthritis 12/31/2019  . Dermatitis 12/31/2019  . Hypothyroidism 12/31/2019  . Mixed anxiety and depressive disorder 12/31/2019  . Morbid obesity ([place]) 12/31/2019  . Non-toxic multinodular goiter 12/31/2019  . Personal history of other specified conditions 12/31/2019  . Rosacea 12/31/2019  . Vitamin D deficiency 12/31/2019  . Unilateral primary osteoarthritis, right knee 12/12/2016  . History of left hip replacement 12/12/2016  . Chronic pain of right knee 12/12/2016  . Arthritis of left hip 06/19/2013  . Status post THR (total hip replacement) 06/19/2013    Current Outpatient Medications on File Prior to Visit  Medication Sig Dispense Refill  . acetaminophen (TYLENOL) 500 MG tablet 1 tablet as needed    . Calcium Carb-Cholecalciferol (CALCIUM + D3) 600-800 MG-UNIT TABS 1 tablet with a meal    . clobetasol (TEMOVATE) 0.05 % external solution Apply 1 application topically 2 (two) times daily.    . cyclobenzaprine (FLEXERIL) 5 MG tablet 1-2 tablets at bedtime as needed    . docusate sodium (COLACE) 100 MG capsule Take 1 capsule (100 mg total) by mouth 2 (two) times daily. 10 capsule 0  . doxycycline (MONODOX) 100 MG capsule Take by mouth daily.    Marland Kitchen escitalopram (LEXAPRO) 10 MG tablet Take 10 mg by mouth daily.    . fexofenadine (ALLEGRA) 180 MG tablet 1 tablet    . ibuprofen (ADVIL) 800 MG tablet Take 1 tablet  (800 mg total) by mouth every 8 (eight) hours as needed. 30 tablet 0  . lamoTRIgine (LAMICTAL) 100 MG tablet Take 100 mg by mouth 2 (two) times daily.    Marland Kitchen levothyroxine (SYNTHROID) 100 MCG tablet TAKE 1 TABLET BY MOUTH EVERY DAY BEFORE BREKAFAST 30 tablet 1  . metroNIDAZOLE (METROGEL) 1 % gel APPLY TOPICALLY DAILY. 55 g 0  . nabumetone (RELAFEN) 750 MG tablet TAKE 1 TABLET TWICE A DAY AS NEEDED 60 tablet 0  . promethazine (PHENERGAN) 12.5 MG tablet Take 1 tablet (12.5 mg total) by mouth every 8 (eight) hours as needed for nausea or vomiting. 20 tablet 0  . UNABLE TO FIND cbd gummies 1 daily     No current facility-administered medications on file prior to visit.    Allergies  Allergen Reactions  . Other     Other reaction(s): congestion, sneezing Other reaction(s): congestion, sneezing    Objective: There were no vitals filed for this visit.  General: No acute distress, AAOx3  Right foot: Incision healing well with dry scab no erythema, no warmth, no drainage, no signs of infection noted, minimal edema to the right foot, capillary fill time <3 seconds in all digits, gross sensation present via light touch to right foot. No pain or crepitation with range of motion right foot.  No pain with calf compression.    Assessment and Plan:  Problem List Items Addressed This Visit   None   Visit Diagnoses  Status post right foot surgery    -  Primary   Soft tissue mass       Ganglion cyst of right foot       Pain in right foot           -Patient seen and evaluated -Applied protective Band-Aid dressing to right lateral foot and advised patient to do the same to prevent her shoe from rubbing this area -Advised patient to ice to help with any soreness to the ball of the foot and to try to wear good supportive shoes to prevent compensation pain -Advised patient to use Ace wrap or elevate to help with swelling -Will plan for another incision check before patient has to move out of  state.  Landis Martins, DPM

## 2020-04-13 DIAGNOSIS — R922 Inconclusive mammogram: Secondary | ICD-10-CM | POA: Diagnosis not present

## 2020-04-13 DIAGNOSIS — R928 Other abnormal and inconclusive findings on diagnostic imaging of breast: Secondary | ICD-10-CM | POA: Diagnosis not present

## 2020-04-13 DIAGNOSIS — N6002 Solitary cyst of left breast: Secondary | ICD-10-CM | POA: Diagnosis not present

## 2020-04-14 ENCOUNTER — Encounter: Payer: Self-pay | Admitting: Sports Medicine

## 2020-04-14 ENCOUNTER — Ambulatory Visit (INDEPENDENT_AMBULATORY_CARE_PROVIDER_SITE_OTHER): Payer: Medicare Other | Admitting: Sports Medicine

## 2020-04-14 ENCOUNTER — Other Ambulatory Visit: Payer: Self-pay

## 2020-04-14 DIAGNOSIS — M79671 Pain in right foot: Secondary | ICD-10-CM

## 2020-04-14 DIAGNOSIS — M67471 Ganglion, right ankle and foot: Secondary | ICD-10-CM

## 2020-04-14 DIAGNOSIS — Z9889 Other specified postprocedural states: Secondary | ICD-10-CM

## 2020-04-14 DIAGNOSIS — M7989 Other specified soft tissue disorders: Secondary | ICD-10-CM

## 2020-04-14 NOTE — Progress Notes (Signed)
Subjective: Audrey Chandler is a 65 y.o. female patient seen today in office for POV # 3 (DOS 02/22/2020), S/P excision of soft tissue mass right foot. Patient denies pain at surgical site, denies calf pain, denies headache, chest pain, shortness of breath, nausea, vomiting, fever, or chills.  Patient reports that she has been having balance issues.  No other issues noted.   Patient Active Problem List   Diagnosis Date Noted  . Body mass index (BMI) 40.0-44.9, adult ([place]) 03/31/2020  . Cervical spondylosis 12/31/2019  . Arthritis 12/31/2019  . Dermatitis 12/31/2019  . Hypothyroidism 12/31/2019  . Mixed anxiety and depressive disorder 12/31/2019  . Morbid obesity ([place]) 12/31/2019  . Non-toxic multinodular goiter 12/31/2019  . Personal history of other specified conditions 12/31/2019  . Rosacea 12/31/2019  . Vitamin D deficiency 12/31/2019  . Unilateral primary osteoarthritis, right knee 12/12/2016  . History of left hip replacement 12/12/2016  . Chronic pain of right knee 12/12/2016  . Arthritis of left hip 06/19/2013  . Status post THR (total hip replacement) 06/19/2013    Current Outpatient Medications on File Prior to Visit  Medication Sig Dispense Refill  . acetaminophen (TYLENOL) 500 MG tablet 1 tablet as needed    . Calcium Carb-Cholecalciferol (CALCIUM + D3) 600-800 MG-UNIT TABS 1 tablet with a meal    . clobetasol (TEMOVATE) 0.05 % external solution Apply 1 application topically 2 (two) times daily.    . cyclobenzaprine (FLEXERIL) 5 MG tablet 1-2 tablets at bedtime as needed    . docusate sodium (COLACE) 100 MG capsule Take 1 capsule (100 mg total) by mouth 2 (two) times daily. 10 capsule 0  . doxycycline (MONODOX) 100 MG capsule Take by mouth daily.    Marland Kitchen escitalopram (LEXAPRO) 10 MG tablet Take 10 mg by mouth daily.    . fexofenadine (ALLEGRA) 180 MG tablet 1 tablet    . ibuprofen (ADVIL) 800 MG tablet Take 1 tablet (800 mg total) by mouth every 8 (eight) hours as needed. 30  tablet 0  . lamoTRIgine (LAMICTAL) 100 MG tablet Take 100 mg by mouth 2 (two) times daily.    Marland Kitchen levothyroxine (SYNTHROID) 100 MCG tablet TAKE 1 TABLET BY MOUTH EVERY DAY BEFORE BREKAFAST 30 tablet 1  . levothyroxine (SYNTHROID) 75 MCG tablet     . metroNIDAZOLE (METROGEL) 1 % gel APPLY TOPICALLY DAILY. 55 g 0  . nabumetone (RELAFEN) 750 MG tablet TAKE 1 TABLET TWICE A DAY AS NEEDED 60 tablet 0  . promethazine (PHENERGAN) 12.5 MG tablet Take 1 tablet (12.5 mg total) by mouth every 8 (eight) hours as needed for nausea or vomiting. 20 tablet 0  . UNABLE TO FIND cbd gummies 1 daily     No current facility-administered medications on file prior to visit.    Allergies  Allergen Reactions  . Other     Other reaction(s): congestion, sneezing Other reaction(s): congestion, sneezing    Objective: There were no vitals filed for this visit.  General: No acute distress, AAOx3  Right foot: Incision with dry scab no erythema, no warmth, no drainage, no signs of infection noted, minimal edema to the right foot, capillary fill time <3 seconds in all digits, gross sensation present via light touch to right foot. No pain or crepitation with range of motion right foot.  No pain with calf compression.    Assessment and Plan:  Problem List Items Addressed This Visit   None   Visit Diagnoses    Status post right foot surgery    -  Primary   Soft tissue mass       Ganglion cyst of right foot       Pain in right foot         -Patient seen and evaluated -Recommend good supportive shoe that does not rub area -Advised patient to try range of motion exercises until she moves and then can refer her for PT locally to wear she is moving to -Return PRN.   Landis Martins, DPM
